# Patient Record
Sex: Female | Born: 1945 | Race: White | Hispanic: No | Marital: Married | Smoking: Current every day smoker
Health system: Southern US, Community
[De-identification: ages and names within clinical notes are randomized; demographics above are authoritative.]

## PROBLEM LIST (undated history)

## (undated) DIAGNOSIS — K069 Disorder of gingiva and edentulous alveolar ridge, unspecified: Secondary | ICD-10-CM

## (undated) DIAGNOSIS — H269 Unspecified cataract: Secondary | ICD-10-CM

## (undated) DIAGNOSIS — J439 Emphysema, unspecified: Secondary | ICD-10-CM

## (undated) DIAGNOSIS — J189 Pneumonia, unspecified organism: Secondary | ICD-10-CM

## (undated) DIAGNOSIS — E119 Type 2 diabetes mellitus without complications: Secondary | ICD-10-CM

## (undated) DIAGNOSIS — J449 Chronic obstructive pulmonary disease, unspecified: Secondary | ICD-10-CM

## (undated) HISTORY — DX: Emphysema, unspecified: J43.9

## (undated) HISTORY — DX: Unspecified cataract: H26.9

## (undated) HISTORY — DX: Chronic obstructive pulmonary disease, unspecified: J44.9

## (undated) HISTORY — DX: Disorder of gingiva and edentulous alveolar ridge, unspecified: K06.9

## (undated) HISTORY — DX: Type 2 diabetes mellitus without complications: E11.9

## (undated) HISTORY — PX: WRIST SURGERY: SHX841

## (undated) HISTORY — DX: Pneumonia, unspecified organism: J18.9

## (undated) HISTORY — PX: TONSILLECTOMY: SUR1361

## (undated) HISTORY — PX: EYE SURGERY: SHX253

---

## 1997-10-15 ENCOUNTER — Emergency Department (HOSPITAL_COMMUNITY): Admission: EM | Admit: 1997-10-15 | Discharge: 1997-10-15 | Payer: Self-pay | Admitting: Emergency Medicine

## 1999-11-26 ENCOUNTER — Other Ambulatory Visit: Admission: RE | Admit: 1999-11-26 | Discharge: 1999-11-26 | Payer: Self-pay | Admitting: Internal Medicine

## 2001-07-29 ENCOUNTER — Encounter: Payer: Self-pay | Admitting: Unknown Physician Specialty

## 2001-07-29 ENCOUNTER — Encounter: Admission: RE | Admit: 2001-07-29 | Discharge: 2001-07-29 | Payer: Self-pay | Admitting: Unknown Physician Specialty

## 2009-03-01 ENCOUNTER — Encounter: Admission: RE | Admit: 2009-03-01 | Discharge: 2009-03-01 | Payer: Self-pay | Admitting: Unknown Physician Specialty

## 2009-10-14 ENCOUNTER — Encounter: Admission: RE | Admit: 2009-10-14 | Discharge: 2009-10-14 | Payer: Self-pay | Admitting: Unknown Physician Specialty

## 2011-11-10 ENCOUNTER — Encounter: Payer: Self-pay | Admitting: Gastroenterology

## 2012-04-22 ENCOUNTER — Other Ambulatory Visit: Payer: Self-pay | Admitting: Orthopedic Surgery

## 2012-04-22 ENCOUNTER — Ambulatory Visit
Admission: RE | Admit: 2012-04-22 | Discharge: 2012-04-22 | Disposition: A | Discharge status: Home / Self Care | Payer: Managed Care, Other (non HMO) | Source: Ambulatory Visit | Attending: Orthopedic Surgery | Admitting: Orthopedic Surgery

## 2012-04-22 DIAGNOSIS — M79673 Pain in unspecified foot: Secondary | ICD-10-CM

## 2012-04-22 DIAGNOSIS — R609 Edema, unspecified: Secondary | ICD-10-CM

## 2012-04-25 ENCOUNTER — Other Ambulatory Visit: Payer: Self-pay | Admitting: Orthopedic Surgery

## 2012-07-07 ENCOUNTER — Encounter: Payer: Self-pay | Admitting: Gastroenterology

## 2012-10-04 ENCOUNTER — Other Ambulatory Visit: Payer: Self-pay | Admitting: Unknown Physician Specialty

## 2012-10-04 DIAGNOSIS — Z780 Asymptomatic menopausal state: Secondary | ICD-10-CM

## 2012-10-04 DIAGNOSIS — Z1231 Encounter for screening mammogram for malignant neoplasm of breast: Secondary | ICD-10-CM

## 2012-10-11 ENCOUNTER — Ambulatory Visit (INDEPENDENT_AMBULATORY_CARE_PROVIDER_SITE_OTHER): Payer: Managed Care, Other (non HMO)

## 2012-10-11 DIAGNOSIS — Z1231 Encounter for screening mammogram for malignant neoplasm of breast: Secondary | ICD-10-CM

## 2012-10-11 DIAGNOSIS — R928 Other abnormal and inconclusive findings on diagnostic imaging of breast: Secondary | ICD-10-CM

## 2012-10-11 DIAGNOSIS — Z780 Asymptomatic menopausal state: Secondary | ICD-10-CM

## 2012-10-11 DIAGNOSIS — M810 Age-related osteoporosis without current pathological fracture: Secondary | ICD-10-CM

## 2012-10-13 ENCOUNTER — Other Ambulatory Visit: Payer: Self-pay | Admitting: Unknown Physician Specialty

## 2012-10-13 DIAGNOSIS — R928 Other abnormal and inconclusive findings on diagnostic imaging of breast: Secondary | ICD-10-CM

## 2012-10-27 ENCOUNTER — Ambulatory Visit
Admission: RE | Admit: 2012-10-27 | Discharge: 2012-10-27 | Disposition: A | Discharge status: Home / Self Care | Payer: Medicare Other | Source: Ambulatory Visit | Attending: Unknown Physician Specialty | Admitting: Unknown Physician Specialty

## 2012-10-27 DIAGNOSIS — R928 Other abnormal and inconclusive findings on diagnostic imaging of breast: Secondary | ICD-10-CM

## 2013-10-26 ENCOUNTER — Encounter: Payer: Self-pay | Admitting: Gastroenterology

## 2013-10-31 ENCOUNTER — Other Ambulatory Visit: Payer: Self-pay | Admitting: Unknown Physician Specialty

## 2013-10-31 ENCOUNTER — Ambulatory Visit (AMBULATORY_SURGERY_CENTER): Payer: Self-pay

## 2013-10-31 VITALS — Ht 62.0 in | Wt 165.8 lb

## 2013-10-31 DIAGNOSIS — N6320 Unspecified lump in the left breast, unspecified quadrant: Secondary | ICD-10-CM

## 2013-10-31 DIAGNOSIS — Z1211 Encounter for screening for malignant neoplasm of colon: Secondary | ICD-10-CM

## 2013-10-31 MED ORDER — NA SULFATE-K SULFATE-MG SULF 17.5-3.13-1.6 GM/177ML PO SOLN: ORAL | Status: DC

## 2013-10-31 NOTE — Progress Notes (Signed)
Per pt, no allergies to soy or egg products.Pt not taking any weight loss meds or using  O2 at home. 

## 2013-11-07 ENCOUNTER — Ambulatory Visit
Admission: RE | Admit: 2013-11-07 | Discharge: 2013-11-07 | Disposition: A | Discharge status: Home / Self Care | Payer: Medicare Other | Source: Ambulatory Visit | Attending: Unknown Physician Specialty | Admitting: Unknown Physician Specialty

## 2013-11-07 ENCOUNTER — Encounter (INDEPENDENT_AMBULATORY_CARE_PROVIDER_SITE_OTHER): Payer: Self-pay

## 2013-11-07 DIAGNOSIS — N6320 Unspecified lump in the left breast, unspecified quadrant: Secondary | ICD-10-CM

## 2013-11-09 ENCOUNTER — Encounter: Payer: Self-pay | Admitting: Gastroenterology

## 2013-11-10 ENCOUNTER — Ambulatory Visit (AMBULATORY_SURGERY_CENTER): Payer: Medicare Other | Admitting: Gastroenterology

## 2013-11-10 ENCOUNTER — Encounter: Payer: Self-pay | Admitting: Gastroenterology

## 2013-11-10 VITALS — BP 119/79 | HR 79 | Temp 97.0°F | Resp 23 | Ht 62.0 in | Wt 165.0 lb

## 2013-11-10 DIAGNOSIS — K5730 Diverticulosis of large intestine without perforation or abscess without bleeding: Secondary | ICD-10-CM

## 2013-11-10 DIAGNOSIS — K648 Other hemorrhoids: Secondary | ICD-10-CM

## 2013-11-10 DIAGNOSIS — Z1211 Encounter for screening for malignant neoplasm of colon: Secondary | ICD-10-CM

## 2013-11-10 MED ORDER — SODIUM CHLORIDE 0.9 % IV SOLN: 500.0000 mL | INTRAVENOUS | Status: DC

## 2013-11-10 NOTE — Progress Notes (Signed)
No problems noted in the recovery room. maw 

## 2013-11-10 NOTE — Op Note (Signed)
[facility] [address] [place], [id number]   COLONOSCOPY PROCEDURE REPORT  PATIENT: Sheila Holmes, Sheila J.  MR#: [id number] BIRTHDATE: Aug 10, 1945 , 68  yrs. old GENDER: Female ENDOSCOPIST: Louis Meckelobert D Marlene Beidler, MD REFERRED BY: PROCEDURE DATE:  11/10/2013 PROCEDURE:   Colonoscopy, diagnostic First Screening Colonoscopy - Avg.  risk and is 50 yrs.  old or older - No.  Prior Negative Screening - Now for repeat screening. 10 or more years since last screening  History of Adenoma - Now for follow-up colonoscopy & has been > or = to 3 yrs.  N/A  Polyps Removed Today? No.  Recommend repeat exam, <10 yrs? No. ASA CLASS:   Class II INDICATIONS:Average risk patient for colon cancer. MEDICATIONS: MAC sedation, administered by CRNA and propofol (Diprivan) 250mg  IV  DESCRIPTION OF PROCEDURE:   After the risks benefits and alternatives of the procedure were thoroughly explained, informed consent was obtained.  A digital rectal exam revealed no abnormalities of the rectum.   The LB [id number]  endoscope was introduced through the anus and advanced to the ileum. No adverse events experienced.   The quality of the prep was excellent using Suprep  The instrument was then slowly withdrawn as the colon was fully examined.      COLON FINDINGS: Mild diverticulosis was noted in the sigmoid colon. Internal hemorrhoids were found.   The colon was otherwise normal. There was no diverticulosis, inflammation, polyps or cancers unless previously stated.  Retroflexed views revealed no abnormalities. The time to cecum=3 minutes 21 seconds.  Withdrawal time=7 minutes 27 seconds.  The scope was withdrawn and the procedure completed. COMPLICATIONS: There were no complications.  ENDOSCOPIC IMPRESSION: 1.   Mild diverticulosis was noted in the sigmoid colon 2.   Internal hemorrhoids 3.   The colon was otherwise normal  RECOMMENDATIONS: Continue current colorectal screening  recommendations for "routine risk" patients with a repeat colonoscopy in 10 years.   eSigned:  Louis Meckelobert D Kathline Banbury, MD 11/10/2013 10:59 AM   cc: Loleta DickerErin Judge, MD   PATIENT NAME:  Sheila Holmes, Sheila J. MR#: [id number]

## 2013-11-10 NOTE — Patient Instructions (Signed)
YOU HAD AN ENDOSCOPIC PROCEDURE TODAY AT THE [facility]: Refer to the procedure report that was given to you for any specific questions about what was found during the examination.  If the procedure report does not answer your questions, please call your gastroenterologist to clarify.  If you requested that your care partner not be given the details of your procedure findings, then the procedure report has been included in a sealed envelope for you to review at your convenience later.  YOU SHOULD EXPECT: Some feelings of bloating in the abdomen. Passage of more gas than usual.  Walking can help get rid of the air that was put into your GI tract during the procedure and reduce the bloating. If you had a lower endoscopy (such as a colonoscopy or flexible sigmoidoscopy) you may notice spotting of blood in your stool or on the toilet paper. If you underwent a bowel prep for your procedure, then you may not have a normal bowel movement for a few days.  DIET: Your first meal following the procedure should be a light meal and then it is ok to progress to your normal diet.  A half-sandwich or bowl of soup is an example of a good first meal.  Heavy or fried foods are harder to digest and may make you feel nauseous or bloated.  Likewise meals heavy in dairy and vegetables can cause extra gas to form and this can also increase the bloating.  Drink plenty of fluids but you should avoid alcoholic beverages for 24 hours.  ACTIVITY: Your care partner should take you home directly after the procedure.  You should plan to take it easy, moving slowly for the rest of the day.  You can resume normal activity the day after the procedure however you should NOT DRIVE or use heavy machinery for 24 hours (because of the sedation medicines used during the test).    SYMPTOMS TO REPORT IMMEDIATELY: A gastroenterologist can be reached at any hour.  During normal business hours, 8:30 AM to 5:00 PM Monday through Friday,  call [phone].  After hours and on weekends, please call the GI answering service at [phone] who will take a message and have the physician on call contact you.   Following lower endoscopy (colonoscopy or flexible sigmoidoscopy):  Excessive amounts of blood in the stool  Significant tenderness or worsening of abdominal pains  Swelling of the abdomen that is new, acute  Fever of 100F or higher   FOLLOW UP: If any biopsies were taken you will be contacted by phone or by letter within the next 1-3 weeks.  Call your gastroenterologist if you have not heard about the biopsies in 3 weeks.  Our staff will call the home number listed on your records the next business day following your procedure to check on you and address any questions or concerns that you may have at that time regarding the information given to you following your procedure. This is a courtesy call and so if there is no answer at the home number and we have not heard from you through the emergency physician on call, we will assume that you have returned to your regular daily activities without incident.  SIGNATURES/CONFIDENTIALITY: You and/or your care partner have signed paperwork which will be entered into your electronic medical record.  These signatures attest to the fact that that the information above on your After Visit Summary has been reviewed and is understood.  Full responsibility of the confidentiality of   this discharge information lies with you and/or your care-partner.     Handouts were given to your care partner on  diverticulosis, a high fiber diet with liberal fluid intake and hemorrhoids. You may resume your current medications today. Please call if any questions or concerns.   

## 2013-11-10 NOTE — Progress Notes (Signed)
Report to PACU, RN, vss, BBS= Clear.  

## 2013-11-13 ENCOUNTER — Telehealth: Payer: Self-pay | Admitting: *Deleted

## 2013-11-13 NOTE — Telephone Encounter (Signed)
  Follow up Call-  Call back number 11/10/2013  Post procedure Call Back phone  # [phone]  Permission to leave phone message Yes     Patient questions:  Do you have a fever, pain , or abdominal swelling? No. Pain Score  0 *  Have you tolerated food without any problems? Yes.    Have you been able to return to your normal activities? Yes.    Do you have any questions about your discharge instructions: Diet   No. Medications  No. Follow up visit  No.  Do you have questions or concerns about your Care? No.  Actions: * If pain score is 4 or above: No action needed, pain <4.

## 2014-07-10 DIAGNOSIS — E119 Type 2 diabetes mellitus without complications: Secondary | ICD-10-CM | POA: Diagnosis not present

## 2014-07-10 DIAGNOSIS — M810 Age-related osteoporosis without current pathological fracture: Secondary | ICD-10-CM | POA: Diagnosis not present

## 2014-07-10 DIAGNOSIS — E780 Pure hypercholesterolemia: Secondary | ICD-10-CM | POA: Diagnosis not present

## 2014-07-10 DIAGNOSIS — J449 Chronic obstructive pulmonary disease, unspecified: Secondary | ICD-10-CM | POA: Diagnosis not present

## 2014-07-19 DIAGNOSIS — R1310 Dysphagia, unspecified: Secondary | ICD-10-CM | POA: Diagnosis not present

## 2014-07-31 DIAGNOSIS — H524 Presbyopia: Secondary | ICD-10-CM | POA: Diagnosis not present

## 2014-08-07 DIAGNOSIS — R1310 Dysphagia, unspecified: Secondary | ICD-10-CM | POA: Diagnosis not present

## 2014-08-07 DIAGNOSIS — R1313 Dysphagia, pharyngeal phase: Secondary | ICD-10-CM | POA: Diagnosis not present

## 2014-11-06 DIAGNOSIS — Z0000 Encounter for general adult medical examination without abnormal findings: Secondary | ICD-10-CM | POA: Diagnosis not present

## 2014-11-06 DIAGNOSIS — R5383 Other fatigue: Secondary | ICD-10-CM | POA: Diagnosis not present

## 2014-11-06 DIAGNOSIS — E780 Pure hypercholesterolemia: Secondary | ICD-10-CM | POA: Diagnosis not present

## 2014-11-06 DIAGNOSIS — E119 Type 2 diabetes mellitus without complications: Secondary | ICD-10-CM | POA: Diagnosis not present

## 2014-11-06 DIAGNOSIS — M810 Age-related osteoporosis without current pathological fracture: Secondary | ICD-10-CM | POA: Diagnosis not present

## 2014-11-06 DIAGNOSIS — Z1289 Encounter for screening for malignant neoplasm of other sites: Secondary | ICD-10-CM | POA: Diagnosis not present

## 2014-11-06 DIAGNOSIS — J449 Chronic obstructive pulmonary disease, unspecified: Secondary | ICD-10-CM | POA: Diagnosis not present

## 2014-11-06 DIAGNOSIS — Z23 Encounter for immunization: Secondary | ICD-10-CM | POA: Diagnosis not present

## 2014-11-27 DIAGNOSIS — Z23 Encounter for immunization: Secondary | ICD-10-CM | POA: Diagnosis not present

## 2014-12-21 ENCOUNTER — Other Ambulatory Visit: Payer: Self-pay | Admitting: Unknown Physician Specialty

## 2014-12-21 DIAGNOSIS — N63 Unspecified lump in unspecified breast: Secondary | ICD-10-CM

## 2014-12-27 ENCOUNTER — Ambulatory Visit
Admission: RE | Admit: 2014-12-27 | Discharge: 2014-12-27 | Disposition: A | Discharge status: Home / Self Care | Payer: Medicare Other | Source: Ambulatory Visit | Attending: Unknown Physician Specialty | Admitting: Unknown Physician Specialty

## 2014-12-27 DIAGNOSIS — N63 Unspecified lump in unspecified breast: Secondary | ICD-10-CM

## 2014-12-27 DIAGNOSIS — N6489 Other specified disorders of breast: Secondary | ICD-10-CM | POA: Diagnosis not present

## 2014-12-27 DIAGNOSIS — R928 Other abnormal and inconclusive findings on diagnostic imaging of breast: Secondary | ICD-10-CM | POA: Diagnosis not present

## 2015-03-06 DIAGNOSIS — E559 Vitamin D deficiency, unspecified: Secondary | ICD-10-CM | POA: Diagnosis not present

## 2015-03-06 DIAGNOSIS — J449 Chronic obstructive pulmonary disease, unspecified: Secondary | ICD-10-CM | POA: Diagnosis not present

## 2015-03-06 DIAGNOSIS — E7800 Pure hypercholesterolemia, unspecified: Secondary | ICD-10-CM | POA: Diagnosis not present

## 2015-03-06 DIAGNOSIS — M810 Age-related osteoporosis without current pathological fracture: Secondary | ICD-10-CM | POA: Diagnosis not present

## 2015-03-06 DIAGNOSIS — E119 Type 2 diabetes mellitus without complications: Secondary | ICD-10-CM | POA: Diagnosis not present

## 2015-03-06 DIAGNOSIS — M899 Disorder of bone, unspecified: Secondary | ICD-10-CM | POA: Diagnosis not present

## 2015-07-04 DIAGNOSIS — E7800 Pure hypercholesterolemia, unspecified: Secondary | ICD-10-CM | POA: Diagnosis not present

## 2015-07-04 DIAGNOSIS — M810 Age-related osteoporosis without current pathological fracture: Secondary | ICD-10-CM | POA: Diagnosis not present

## 2015-07-04 DIAGNOSIS — J449 Chronic obstructive pulmonary disease, unspecified: Secondary | ICD-10-CM | POA: Diagnosis not present

## 2015-07-04 DIAGNOSIS — E119 Type 2 diabetes mellitus without complications: Secondary | ICD-10-CM | POA: Diagnosis not present

## 2015-07-04 DIAGNOSIS — S40252A Superficial foreign body of left shoulder, initial encounter: Secondary | ICD-10-CM | POA: Diagnosis not present

## 2015-08-05 DIAGNOSIS — S80261A Insect bite (nonvenomous), right knee, initial encounter: Secondary | ICD-10-CM | POA: Diagnosis not present

## 2015-08-05 DIAGNOSIS — W57XXXA Bitten or stung by nonvenomous insect and other nonvenomous arthropods, initial encounter: Secondary | ICD-10-CM | POA: Diagnosis not present

## 2015-10-28 DIAGNOSIS — E119 Type 2 diabetes mellitus without complications: Secondary | ICD-10-CM | POA: Diagnosis not present

## 2015-10-28 DIAGNOSIS — H5203 Hypermetropia, bilateral: Secondary | ICD-10-CM | POA: Diagnosis not present

## 2015-10-28 DIAGNOSIS — Z961 Presence of intraocular lens: Secondary | ICD-10-CM | POA: Diagnosis not present

## 2015-10-28 DIAGNOSIS — H509 Unspecified strabismus: Secondary | ICD-10-CM | POA: Diagnosis not present

## 2015-11-05 DIAGNOSIS — E559 Vitamin D deficiency, unspecified: Secondary | ICD-10-CM | POA: Diagnosis not present

## 2015-11-05 DIAGNOSIS — E119 Type 2 diabetes mellitus without complications: Secondary | ICD-10-CM | POA: Diagnosis not present

## 2015-11-05 DIAGNOSIS — J449 Chronic obstructive pulmonary disease, unspecified: Secondary | ICD-10-CM | POA: Diagnosis not present

## 2015-11-05 DIAGNOSIS — K5790 Diverticulosis of intestine, part unspecified, without perforation or abscess without bleeding: Secondary | ICD-10-CM | POA: Diagnosis not present

## 2015-11-05 DIAGNOSIS — Z0000 Encounter for general adult medical examination without abnormal findings: Secondary | ICD-10-CM | POA: Diagnosis not present

## 2015-11-05 DIAGNOSIS — Z124 Encounter for screening for malignant neoplasm of cervix: Secondary | ICD-10-CM | POA: Diagnosis not present

## 2015-11-05 DIAGNOSIS — Z1231 Encounter for screening mammogram for malignant neoplasm of breast: Secondary | ICD-10-CM | POA: Diagnosis not present

## 2015-11-05 DIAGNOSIS — E7800 Pure hypercholesterolemia, unspecified: Secondary | ICD-10-CM | POA: Diagnosis not present

## 2015-11-05 DIAGNOSIS — M810 Age-related osteoporosis without current pathological fracture: Secondary | ICD-10-CM | POA: Diagnosis not present

## 2015-12-10 DIAGNOSIS — Z23 Encounter for immunization: Secondary | ICD-10-CM | POA: Diagnosis not present

## 2015-12-16 ENCOUNTER — Other Ambulatory Visit: Payer: Self-pay | Admitting: Unknown Physician Specialty

## 2015-12-16 DIAGNOSIS — R5381 Other malaise: Secondary | ICD-10-CM

## 2015-12-17 ENCOUNTER — Other Ambulatory Visit: Payer: Self-pay | Admitting: Unknown Physician Specialty

## 2015-12-17 DIAGNOSIS — M810 Age-related osteoporosis without current pathological fracture: Secondary | ICD-10-CM

## 2015-12-17 DIAGNOSIS — Z1231 Encounter for screening mammogram for malignant neoplasm of breast: Secondary | ICD-10-CM

## 2015-12-17 DIAGNOSIS — Z780 Asymptomatic menopausal state: Secondary | ICD-10-CM

## 2015-12-31 ENCOUNTER — Ambulatory Visit (INDEPENDENT_AMBULATORY_CARE_PROVIDER_SITE_OTHER): Payer: Medicare Other

## 2015-12-31 DIAGNOSIS — M810 Age-related osteoporosis without current pathological fracture: Secondary | ICD-10-CM | POA: Diagnosis not present

## 2015-12-31 DIAGNOSIS — M85852 Other specified disorders of bone density and structure, left thigh: Secondary | ICD-10-CM

## 2015-12-31 DIAGNOSIS — Z1231 Encounter for screening mammogram for malignant neoplasm of breast: Secondary | ICD-10-CM | POA: Diagnosis not present

## 2016-03-05 DIAGNOSIS — M810 Age-related osteoporosis without current pathological fracture: Secondary | ICD-10-CM | POA: Diagnosis not present

## 2016-03-05 DIAGNOSIS — E119 Type 2 diabetes mellitus without complications: Secondary | ICD-10-CM | POA: Diagnosis not present

## 2016-03-05 DIAGNOSIS — E7800 Pure hypercholesterolemia, unspecified: Secondary | ICD-10-CM | POA: Diagnosis not present

## 2016-03-05 DIAGNOSIS — J449 Chronic obstructive pulmonary disease, unspecified: Secondary | ICD-10-CM | POA: Diagnosis not present

## 2016-05-18 DIAGNOSIS — L03312 Cellulitis of back [any part except buttock]: Secondary | ICD-10-CM | POA: Diagnosis not present

## 2016-05-18 DIAGNOSIS — S40251A Superficial foreign body of right shoulder, initial encounter: Secondary | ICD-10-CM | POA: Diagnosis not present

## 2016-07-22 DIAGNOSIS — E7800 Pure hypercholesterolemia, unspecified: Secondary | ICD-10-CM | POA: Diagnosis not present

## 2016-07-22 DIAGNOSIS — H527 Unspecified disorder of refraction: Secondary | ICD-10-CM | POA: Diagnosis not present

## 2016-07-22 DIAGNOSIS — H53002 Unspecified amblyopia, left eye: Secondary | ICD-10-CM | POA: Diagnosis not present

## 2016-07-22 DIAGNOSIS — H538 Other visual disturbances: Secondary | ICD-10-CM | POA: Diagnosis not present

## 2016-07-22 DIAGNOSIS — E119 Type 2 diabetes mellitus without complications: Secondary | ICD-10-CM | POA: Diagnosis not present

## 2016-07-22 DIAGNOSIS — M810 Age-related osteoporosis without current pathological fracture: Secondary | ICD-10-CM | POA: Diagnosis not present

## 2016-07-22 DIAGNOSIS — J449 Chronic obstructive pulmonary disease, unspecified: Secondary | ICD-10-CM | POA: Diagnosis not present

## 2016-08-24 DIAGNOSIS — E119 Type 2 diabetes mellitus without complications: Secondary | ICD-10-CM | POA: Diagnosis not present

## 2016-08-24 DIAGNOSIS — H53002 Unspecified amblyopia, left eye: Secondary | ICD-10-CM | POA: Diagnosis not present

## 2016-08-24 DIAGNOSIS — H538 Other visual disturbances: Secondary | ICD-10-CM | POA: Diagnosis not present

## 2016-08-24 DIAGNOSIS — H527 Unspecified disorder of refraction: Secondary | ICD-10-CM | POA: Diagnosis not present

## 2016-10-29 DIAGNOSIS — Z961 Presence of intraocular lens: Secondary | ICD-10-CM | POA: Diagnosis not present

## 2016-10-29 DIAGNOSIS — H509 Unspecified strabismus: Secondary | ICD-10-CM | POA: Diagnosis not present

## 2016-10-29 DIAGNOSIS — H5203 Hypermetropia, bilateral: Secondary | ICD-10-CM | POA: Diagnosis not present

## 2016-10-29 DIAGNOSIS — E119 Type 2 diabetes mellitus without complications: Secondary | ICD-10-CM | POA: Diagnosis not present

## 2016-11-05 DIAGNOSIS — Z0000 Encounter for general adult medical examination without abnormal findings: Secondary | ICD-10-CM | POA: Diagnosis not present

## 2016-11-05 DIAGNOSIS — E559 Vitamin D deficiency, unspecified: Secondary | ICD-10-CM | POA: Diagnosis not present

## 2016-11-05 DIAGNOSIS — Z1231 Encounter for screening mammogram for malignant neoplasm of breast: Secondary | ICD-10-CM | POA: Diagnosis not present

## 2016-11-05 DIAGNOSIS — E119 Type 2 diabetes mellitus without complications: Secondary | ICD-10-CM | POA: Diagnosis not present

## 2016-11-05 DIAGNOSIS — E7800 Pure hypercholesterolemia, unspecified: Secondary | ICD-10-CM | POA: Diagnosis not present

## 2016-11-05 DIAGNOSIS — M810 Age-related osteoporosis without current pathological fracture: Secondary | ICD-10-CM | POA: Diagnosis not present

## 2016-11-05 DIAGNOSIS — M899 Disorder of bone, unspecified: Secondary | ICD-10-CM | POA: Diagnosis not present

## 2016-12-04 DIAGNOSIS — Z23 Encounter for immunization: Secondary | ICD-10-CM | POA: Diagnosis not present

## 2016-12-08 DIAGNOSIS — Z1211 Encounter for screening for malignant neoplasm of colon: Secondary | ICD-10-CM | POA: Diagnosis not present

## 2017-01-07 DIAGNOSIS — J9801 Acute bronchospasm: Secondary | ICD-10-CM | POA: Diagnosis not present

## 2017-01-07 DIAGNOSIS — J069 Acute upper respiratory infection, unspecified: Secondary | ICD-10-CM | POA: Diagnosis not present

## 2017-04-06 DIAGNOSIS — J439 Emphysema, unspecified: Secondary | ICD-10-CM | POA: Diagnosis not present

## 2017-04-06 DIAGNOSIS — E7800 Pure hypercholesterolemia, unspecified: Secondary | ICD-10-CM | POA: Diagnosis not present

## 2017-04-06 DIAGNOSIS — M810 Age-related osteoporosis without current pathological fracture: Secondary | ICD-10-CM | POA: Diagnosis not present

## 2017-04-06 DIAGNOSIS — E119 Type 2 diabetes mellitus without complications: Secondary | ICD-10-CM | POA: Diagnosis not present

## 2017-04-07 DIAGNOSIS — E119 Type 2 diabetes mellitus without complications: Secondary | ICD-10-CM | POA: Diagnosis not present

## 2017-04-07 DIAGNOSIS — E7800 Pure hypercholesterolemia, unspecified: Secondary | ICD-10-CM | POA: Diagnosis not present

## 2017-06-23 DIAGNOSIS — J439 Emphysema, unspecified: Secondary | ICD-10-CM | POA: Diagnosis not present

## 2017-06-23 DIAGNOSIS — M810 Age-related osteoporosis without current pathological fracture: Secondary | ICD-10-CM | POA: Diagnosis not present

## 2017-06-23 DIAGNOSIS — E7800 Pure hypercholesterolemia, unspecified: Secondary | ICD-10-CM | POA: Diagnosis not present

## 2017-06-23 DIAGNOSIS — E119 Type 2 diabetes mellitus without complications: Secondary | ICD-10-CM | POA: Diagnosis not present

## 2017-06-29 DIAGNOSIS — R600 Localized edema: Secondary | ICD-10-CM | POA: Diagnosis not present

## 2017-07-21 DIAGNOSIS — L03314 Cellulitis of groin: Secondary | ICD-10-CM | POA: Diagnosis not present

## 2017-07-21 DIAGNOSIS — W57XXXA Bitten or stung by nonvenomous insect and other nonvenomous arthropods, initial encounter: Secondary | ICD-10-CM | POA: Diagnosis not present

## 2017-11-08 DIAGNOSIS — Z1231 Encounter for screening mammogram for malignant neoplasm of breast: Secondary | ICD-10-CM | POA: Diagnosis not present

## 2017-11-08 DIAGNOSIS — R5383 Other fatigue: Secondary | ICD-10-CM | POA: Diagnosis not present

## 2017-11-08 DIAGNOSIS — E1169 Type 2 diabetes mellitus with other specified complication: Secondary | ICD-10-CM | POA: Diagnosis not present

## 2017-11-08 DIAGNOSIS — E7800 Pure hypercholesterolemia, unspecified: Secondary | ICD-10-CM | POA: Diagnosis not present

## 2017-11-08 DIAGNOSIS — E559 Vitamin D deficiency, unspecified: Secondary | ICD-10-CM | POA: Diagnosis not present

## 2017-11-08 DIAGNOSIS — Z0000 Encounter for general adult medical examination without abnormal findings: Secondary | ICD-10-CM | POA: Diagnosis not present

## 2017-11-08 DIAGNOSIS — M810 Age-related osteoporosis without current pathological fracture: Secondary | ICD-10-CM | POA: Diagnosis not present

## 2017-11-17 DIAGNOSIS — Z1211 Encounter for screening for malignant neoplasm of colon: Secondary | ICD-10-CM | POA: Diagnosis not present

## 2017-11-23 DIAGNOSIS — D751 Secondary polycythemia: Secondary | ICD-10-CM | POA: Diagnosis not present

## 2017-11-23 DIAGNOSIS — E785 Hyperlipidemia, unspecified: Secondary | ICD-10-CM | POA: Diagnosis not present

## 2017-11-23 DIAGNOSIS — E1169 Type 2 diabetes mellitus with other specified complication: Secondary | ICD-10-CM | POA: Diagnosis not present

## 2017-11-30 DIAGNOSIS — D751 Secondary polycythemia: Secondary | ICD-10-CM | POA: Diagnosis not present

## 2017-12-07 DIAGNOSIS — E1169 Type 2 diabetes mellitus with other specified complication: Secondary | ICD-10-CM | POA: Diagnosis not present

## 2017-12-07 DIAGNOSIS — E785 Hyperlipidemia, unspecified: Secondary | ICD-10-CM | POA: Diagnosis not present

## 2017-12-07 DIAGNOSIS — D751 Secondary polycythemia: Secondary | ICD-10-CM | POA: Diagnosis not present

## 2017-12-14 DIAGNOSIS — E1169 Type 2 diabetes mellitus with other specified complication: Secondary | ICD-10-CM | POA: Diagnosis not present

## 2017-12-14 DIAGNOSIS — D751 Secondary polycythemia: Secondary | ICD-10-CM | POA: Diagnosis not present

## 2017-12-14 DIAGNOSIS — E785 Hyperlipidemia, unspecified: Secondary | ICD-10-CM | POA: Diagnosis not present

## 2017-12-16 DIAGNOSIS — Z23 Encounter for immunization: Secondary | ICD-10-CM | POA: Diagnosis not present

## 2017-12-21 DIAGNOSIS — Z720 Tobacco use: Secondary | ICD-10-CM | POA: Diagnosis not present

## 2017-12-21 DIAGNOSIS — D751 Secondary polycythemia: Secondary | ICD-10-CM | POA: Diagnosis not present

## 2017-12-21 DIAGNOSIS — E785 Hyperlipidemia, unspecified: Secondary | ICD-10-CM | POA: Diagnosis not present

## 2017-12-21 DIAGNOSIS — E1169 Type 2 diabetes mellitus with other specified complication: Secondary | ICD-10-CM | POA: Diagnosis not present

## 2018-01-04 DIAGNOSIS — E785 Hyperlipidemia, unspecified: Secondary | ICD-10-CM | POA: Diagnosis not present

## 2018-01-04 DIAGNOSIS — D751 Secondary polycythemia: Secondary | ICD-10-CM | POA: Diagnosis not present

## 2018-01-04 DIAGNOSIS — E1169 Type 2 diabetes mellitus with other specified complication: Secondary | ICD-10-CM | POA: Diagnosis not present

## 2018-01-18 DIAGNOSIS — E785 Hyperlipidemia, unspecified: Secondary | ICD-10-CM | POA: Diagnosis not present

## 2018-01-18 DIAGNOSIS — D751 Secondary polycythemia: Secondary | ICD-10-CM | POA: Diagnosis not present

## 2018-01-18 DIAGNOSIS — E1169 Type 2 diabetes mellitus with other specified complication: Secondary | ICD-10-CM | POA: Diagnosis not present

## 2018-01-26 DIAGNOSIS — H509 Unspecified strabismus: Secondary | ICD-10-CM | POA: Diagnosis not present

## 2018-01-26 DIAGNOSIS — H532 Diplopia: Secondary | ICD-10-CM | POA: Diagnosis not present

## 2018-01-26 DIAGNOSIS — Z961 Presence of intraocular lens: Secondary | ICD-10-CM | POA: Diagnosis not present

## 2018-01-26 DIAGNOSIS — H5203 Hypermetropia, bilateral: Secondary | ICD-10-CM | POA: Diagnosis not present

## 2018-01-26 DIAGNOSIS — E113291 Type 2 diabetes mellitus with mild nonproliferative diabetic retinopathy without macular edema, right eye: Secondary | ICD-10-CM | POA: Diagnosis not present

## 2018-02-08 DIAGNOSIS — E119 Type 2 diabetes mellitus without complications: Secondary | ICD-10-CM | POA: Diagnosis not present

## 2018-02-08 DIAGNOSIS — Z87891 Personal history of nicotine dependence: Secondary | ICD-10-CM | POA: Diagnosis not present

## 2018-02-08 DIAGNOSIS — D751 Secondary polycythemia: Secondary | ICD-10-CM | POA: Diagnosis not present

## 2018-02-18 DIAGNOSIS — S2231XA Fracture of one rib, right side, initial encounter for closed fracture: Secondary | ICD-10-CM | POA: Diagnosis not present

## 2018-03-07 DIAGNOSIS — R600 Localized edema: Secondary | ICD-10-CM | POA: Diagnosis not present

## 2018-03-07 DIAGNOSIS — E7800 Pure hypercholesterolemia, unspecified: Secondary | ICD-10-CM | POA: Diagnosis not present

## 2018-03-07 DIAGNOSIS — E1169 Type 2 diabetes mellitus with other specified complication: Secondary | ICD-10-CM | POA: Diagnosis not present

## 2018-03-07 DIAGNOSIS — J439 Emphysema, unspecified: Secondary | ICD-10-CM | POA: Diagnosis not present

## 2018-03-08 DIAGNOSIS — D751 Secondary polycythemia: Secondary | ICD-10-CM | POA: Diagnosis not present

## 2018-03-09 ENCOUNTER — Other Ambulatory Visit: Payer: Self-pay | Admitting: Unknown Physician Specialty

## 2018-03-09 ENCOUNTER — Ambulatory Visit (INDEPENDENT_AMBULATORY_CARE_PROVIDER_SITE_OTHER): Payer: Medicare Other

## 2018-03-09 DIAGNOSIS — J449 Chronic obstructive pulmonary disease, unspecified: Secondary | ICD-10-CM | POA: Diagnosis not present

## 2018-03-09 DIAGNOSIS — R05 Cough: Secondary | ICD-10-CM | POA: Diagnosis not present

## 2018-03-09 DIAGNOSIS — R0602 Shortness of breath: Secondary | ICD-10-CM

## 2018-03-10 ENCOUNTER — Emergency Department (HOSPITAL_COMMUNITY): Payer: Medicare Other

## 2018-03-10 ENCOUNTER — Other Ambulatory Visit: Payer: Self-pay

## 2018-03-10 ENCOUNTER — Inpatient Hospital Stay (HOSPITAL_COMMUNITY): Payer: Medicare Other

## 2018-03-10 ENCOUNTER — Inpatient Hospital Stay (HOSPITAL_COMMUNITY)
Admission: EM | Admit: 2018-03-10 | Discharge: 2018-03-15 | DRG: 193 | Disposition: A | Discharge status: Skilled Nursing Facility | Payer: Medicare Other | Attending: Internal Medicine | Admitting: Internal Medicine

## 2018-03-10 ENCOUNTER — Encounter (HOSPITAL_COMMUNITY): Payer: Self-pay | Admitting: Family Medicine

## 2018-03-10 DIAGNOSIS — R740 Nonspecific elevation of levels of transaminase and lactic acid dehydrogenase [LDH]: Secondary | ICD-10-CM

## 2018-03-10 DIAGNOSIS — R404 Transient alteration of awareness: Secondary | ICD-10-CM | POA: Diagnosis not present

## 2018-03-10 DIAGNOSIS — D638 Anemia in other chronic diseases classified elsewhere: Secondary | ICD-10-CM | POA: Diagnosis present

## 2018-03-10 DIAGNOSIS — J80 Acute respiratory distress syndrome: Secondary | ICD-10-CM | POA: Diagnosis not present

## 2018-03-10 DIAGNOSIS — R000 Tachycardia, unspecified: Secondary | ICD-10-CM | POA: Diagnosis not present

## 2018-03-10 DIAGNOSIS — K8020 Calculus of gallbladder without cholecystitis without obstruction: Secondary | ICD-10-CM | POA: Diagnosis not present

## 2018-03-10 DIAGNOSIS — J441 Chronic obstructive pulmonary disease with (acute) exacerbation: Secondary | ICD-10-CM | POA: Diagnosis present

## 2018-03-10 DIAGNOSIS — R7401 Elevation of levels of liver transaminase levels: Secondary | ICD-10-CM | POA: Diagnosis present

## 2018-03-10 DIAGNOSIS — R2689 Other abnormalities of gait and mobility: Secondary | ICD-10-CM | POA: Diagnosis not present

## 2018-03-10 DIAGNOSIS — Z880 Allergy status to penicillin: Secondary | ICD-10-CM | POA: Diagnosis not present

## 2018-03-10 DIAGNOSIS — M7989 Other specified soft tissue disorders: Secondary | ICD-10-CM | POA: Insufficient documentation

## 2018-03-10 DIAGNOSIS — R0602 Shortness of breath: Secondary | ICD-10-CM | POA: Diagnosis not present

## 2018-03-10 DIAGNOSIS — D649 Anemia, unspecified: Secondary | ICD-10-CM | POA: Diagnosis not present

## 2018-03-10 DIAGNOSIS — J9601 Acute respiratory failure with hypoxia: Secondary | ICD-10-CM | POA: Diagnosis not present

## 2018-03-10 DIAGNOSIS — D72819 Decreased white blood cell count, unspecified: Secondary | ICD-10-CM | POA: Diagnosis present

## 2018-03-10 DIAGNOSIS — M79671 Pain in right foot: Secondary | ICD-10-CM | POA: Diagnosis not present

## 2018-03-10 DIAGNOSIS — J180 Bronchopneumonia, unspecified organism: Secondary | ICD-10-CM | POA: Diagnosis not present

## 2018-03-10 DIAGNOSIS — F17210 Nicotine dependence, cigarettes, uncomplicated: Secondary | ICD-10-CM | POA: Diagnosis present

## 2018-03-10 DIAGNOSIS — J449 Chronic obstructive pulmonary disease, unspecified: Secondary | ICD-10-CM | POA: Diagnosis not present

## 2018-03-10 DIAGNOSIS — D751 Secondary polycythemia: Secondary | ICD-10-CM | POA: Diagnosis not present

## 2018-03-10 DIAGNOSIS — E872 Acidosis: Secondary | ICD-10-CM | POA: Diagnosis not present

## 2018-03-10 DIAGNOSIS — Z79899 Other long term (current) drug therapy: Secondary | ICD-10-CM | POA: Diagnosis not present

## 2018-03-10 DIAGNOSIS — Z9181 History of falling: Secondary | ICD-10-CM

## 2018-03-10 DIAGNOSIS — E119 Type 2 diabetes mellitus without complications: Secondary | ICD-10-CM | POA: Diagnosis not present

## 2018-03-10 DIAGNOSIS — Z7984 Long term (current) use of oral hypoglycemic drugs: Secondary | ICD-10-CM | POA: Diagnosis not present

## 2018-03-10 DIAGNOSIS — G9340 Encephalopathy, unspecified: Secondary | ICD-10-CM | POA: Diagnosis not present

## 2018-03-10 DIAGNOSIS — R0902 Hypoxemia: Secondary | ICD-10-CM | POA: Diagnosis not present

## 2018-03-10 DIAGNOSIS — M6281 Muscle weakness (generalized): Secondary | ICD-10-CM | POA: Diagnosis not present

## 2018-03-10 DIAGNOSIS — J189 Pneumonia, unspecified organism: Secondary | ICD-10-CM | POA: Diagnosis not present

## 2018-03-10 DIAGNOSIS — Z7951 Long term (current) use of inhaled steroids: Secondary | ICD-10-CM

## 2018-03-10 DIAGNOSIS — Z7982 Long term (current) use of aspirin: Secondary | ICD-10-CM | POA: Diagnosis not present

## 2018-03-10 DIAGNOSIS — M2550 Pain in unspecified joint: Secondary | ICD-10-CM | POA: Diagnosis not present

## 2018-03-10 DIAGNOSIS — Z7401 Bed confinement status: Secondary | ICD-10-CM | POA: Diagnosis not present

## 2018-03-10 DIAGNOSIS — J439 Emphysema, unspecified: Secondary | ICD-10-CM | POA: Diagnosis present

## 2018-03-10 DIAGNOSIS — J9602 Acute respiratory failure with hypercapnia: Secondary | ICD-10-CM | POA: Diagnosis not present

## 2018-03-10 DIAGNOSIS — E785 Hyperlipidemia, unspecified: Secondary | ICD-10-CM | POA: Diagnosis not present

## 2018-03-10 DIAGNOSIS — R278 Other lack of coordination: Secondary | ICD-10-CM | POA: Diagnosis not present

## 2018-03-10 DIAGNOSIS — S99921A Unspecified injury of right foot, initial encounter: Secondary | ICD-10-CM | POA: Diagnosis not present

## 2018-03-10 DIAGNOSIS — R069 Unspecified abnormalities of breathing: Secondary | ICD-10-CM | POA: Diagnosis not present

## 2018-03-10 DIAGNOSIS — J9600 Acute respiratory failure, unspecified whether with hypoxia or hypercapnia: Secondary | ICD-10-CM | POA: Diagnosis not present

## 2018-03-10 DIAGNOSIS — S0990XA Unspecified injury of head, initial encounter: Secondary | ICD-10-CM | POA: Diagnosis not present

## 2018-03-10 LAB — I-STAT ARTERIAL BLOOD GAS, ED
Acid-Base Excess: 3 mmol/L — ABNORMAL HIGH (ref 0.0–2.0)
Bicarbonate: 33.7 mmol/L — ABNORMAL HIGH (ref 20.0–28.0)
O2 Saturation: 95 %
Patient temperature: 98.6
TCO2: 36 mmol/L — ABNORMAL HIGH (ref 22–32)
pCO2 arterial: 79.2 mmHg (ref 32.0–48.0)
pH, Arterial: 7.237 — ABNORMAL LOW (ref 7.350–7.450)
pO2, Arterial: 91 mmHg (ref 83.0–108.0)

## 2018-03-10 LAB — COMPREHENSIVE METABOLIC PANEL
ALT: 695 U/L — ABNORMAL HIGH (ref 0–44)
ANION GAP: 10 (ref 5–15)
AST: 1928 U/L — ABNORMAL HIGH (ref 15–41)
Albumin: 3 g/dL — ABNORMAL LOW (ref 3.5–5.0)
Alkaline Phosphatase: 71 U/L (ref 38–126)
BUN: 22 mg/dL (ref 8–23)
CO2: 26 mmol/L (ref 22–32)
Calcium: 9.5 mg/dL (ref 8.9–10.3)
Chloride: 105 mmol/L (ref 98–111)
Creatinine, Ser: 0.68 mg/dL (ref 0.44–1.00)
GFR calc non Af Amer: >60 mL/min (ref >60)
Glucose, Bld: 162 mg/dL — ABNORMAL HIGH (ref 70–99)
Potassium: 5.4 mmol/L — ABNORMAL HIGH (ref 3.5–5.1)
Sodium: 141 mmol/L (ref 135–145)
Total Bilirubin: 0.6 mg/dL (ref 0.3–1.2)
Total Protein: 5.9 g/dL — ABNORMAL LOW (ref 6.5–8.1)

## 2018-03-10 LAB — I-STAT VENOUS BLOOD GAS, ED
Acid-Base Excess: 4 mmol/L — ABNORMAL HIGH (ref 0.0–2.0)
Acid-Base Excess: 6 mmol/L — ABNORMAL HIGH (ref 0.0–2.0)
Bicarbonate: 35.9 mmol/L — ABNORMAL HIGH (ref 20.0–28.0)
Bicarbonate: 36.3 mmol/L — ABNORMAL HIGH (ref 20.0–28.0)
O2 SAT: 31 %
O2 Saturation: 73 %
PCO2 VEN: 84 mmHg — AB (ref 44.0–60.0)
TCO2: 39 mmol/L — ABNORMAL HIGH (ref 22–32)
TCO2: 39 mmol/L — ABNORMAL HIGH (ref 22–32)
pCO2, Ven: 96 mmHg (ref 44.0–60.0)
pH, Ven: 7.181 — CL (ref 7.250–7.430)
pH, Ven: 7.244 — ABNORMAL LOW (ref 7.250–7.430)
pO2, Ven: 26 mmHg — CL (ref 32.0–45.0)
pO2, Ven: 48 mmHg — ABNORMAL HIGH (ref 32.0–45.0)

## 2018-03-10 LAB — CBC WITH DIFFERENTIAL/PLATELET
ABS IMMATURE GRANULOCYTES: 0.04 10*3/uL (ref 0.00–0.07)
BASOS PCT: 0 %
Basophils Absolute: 0 10*3/uL (ref 0.0–0.1)
EOS ABS: 0 10*3/uL (ref 0.0–0.5)
Eosinophils Relative: 0 %
HCT: 44.4 % (ref 36.0–46.0)
Hemoglobin: 12.5 g/dL (ref 12.0–15.0)
Immature Granulocytes: 1 %
Lymphocytes Relative: 13 %
Lymphs Abs: 0.5 10*3/uL — ABNORMAL LOW (ref 0.7–4.0)
MCH: 27 pg (ref 26.0–34.0)
MCHC: 28.2 g/dL — ABNORMAL LOW (ref 30.0–36.0)
MCV: 95.9 fL (ref 80.0–100.0)
Monocytes Absolute: 0.3 10*3/uL (ref 0.1–1.0)
Monocytes Relative: 9 %
NEUTROS ABS: 3 10*3/uL (ref 1.7–7.7)
NEUTROS PCT: 77 %
Platelets: 160 10*3/uL (ref 150–400)
RBC: 4.63 MIL/uL (ref 3.87–5.11)
RDW: 20.3 % — ABNORMAL HIGH (ref 11.5–15.5)
WBC: 3.9 10*3/uL — ABNORMAL LOW (ref 4.0–10.5)
nRBC: 0.8 % — ABNORMAL HIGH (ref 0.0–0.2)

## 2018-03-10 MED ORDER — SODIUM CHLORIDE 0.9 % IV SOLN
500.0000 mg | INTRAVENOUS | Status: DC
  Administered 2018-03-11 (×2): 500 mg via INTRAVENOUS
  Filled 2018-03-10 (×2): qty 500

## 2018-03-10 MED ORDER — ENOXAPARIN SODIUM 40 MG/0.4ML ~~LOC~~ SOLN
40.0000 mg | Freq: Every day | SUBCUTANEOUS | Status: DC
  Administered 2018-03-11 – 2018-03-15 (×5): 40 mg via SUBCUTANEOUS
  Filled 2018-03-10 (×5): qty 0.4

## 2018-03-10 MED ORDER — SENNOSIDES-DOCUSATE SODIUM 8.6-50 MG PO TABS: 1.0000 | ORAL_TABLET | Freq: Every evening | ORAL | Status: DC | PRN

## 2018-03-10 MED ORDER — IBUPROFEN 400 MG PO TABS: 400.0000 mg | ORAL_TABLET | Freq: Four times a day (QID) | ORAL | Status: DC | PRN

## 2018-03-10 MED ORDER — SODIUM CHLORIDE 0.9% FLUSH
3.0000 mL | Freq: Two times a day (BID) | INTRAVENOUS | Status: DC
  Administered 2018-03-11 – 2018-03-14 (×6): 3 mL via INTRAVENOUS

## 2018-03-10 MED ORDER — MINOCYCLINE HCL 50 MG PO CAPS
50.0000 mg | ORAL_CAPSULE | Freq: Every day | ORAL | Status: DC
  Administered 2018-03-11 – 2018-03-15 (×5): 50 mg via ORAL
  Filled 2018-03-10 (×5): qty 1

## 2018-03-10 MED ORDER — IPRATROPIUM-ALBUTEROL 0.5-2.5 (3) MG/3ML IN SOLN
3.0000 mL | Freq: Four times a day (QID) | RESPIRATORY_TRACT | Status: DC
  Administered 2018-03-10 – 2018-03-11 (×4): 3 mL via RESPIRATORY_TRACT
  Filled 2018-03-10 (×4): qty 3

## 2018-03-10 MED ORDER — IOPAMIDOL (ISOVUE-370) INJECTION 76%
INTRAVENOUS | Status: AC
  Administered 2018-03-10: 100 mL
  Filled 2018-03-10: qty 100

## 2018-03-10 MED ORDER — ASPIRIN EC 81 MG PO TBEC
81.0000 mg | DELAYED_RELEASE_TABLET | Freq: Every day | ORAL | Status: DC
  Administered 2018-03-11 – 2018-03-15 (×5): 81 mg via ORAL
  Filled 2018-03-10 (×5): qty 1

## 2018-03-10 MED ORDER — INSULIN ASPART 100 UNIT/ML ~~LOC~~ SOLN
0.0000 [IU] | SUBCUTANEOUS | Status: DC
  Administered 2018-03-11: 1 [IU] via SUBCUTANEOUS
  Administered 2018-03-11 (×2): 2 [IU] via SUBCUTANEOUS
  Administered 2018-03-11 (×3): 1 [IU] via SUBCUTANEOUS
  Administered 2018-03-12: 3 [IU] via SUBCUTANEOUS
  Administered 2018-03-12 (×3): 2 [IU] via SUBCUTANEOUS

## 2018-03-10 MED ORDER — MOMETASONE FURO-FORMOTEROL FUM 100-5 MCG/ACT IN AERO
2.0000 | INHALATION_SPRAY | Freq: Two times a day (BID) | RESPIRATORY_TRACT | Status: DC
  Administered 2018-03-11 – 2018-03-15 (×9): 2 via RESPIRATORY_TRACT
  Filled 2018-03-10: qty 8.8

## 2018-03-10 MED ORDER — SODIUM CHLORIDE 0.9% FLUSH
3.0000 mL | Freq: Two times a day (BID) | INTRAVENOUS | Status: DC
  Administered 2018-03-12 – 2018-03-15 (×4): 3 mL via INTRAVENOUS

## 2018-03-10 MED ORDER — SODIUM CHLORIDE 0.9% FLUSH: 3.0000 mL | INTRAVENOUS | Status: DC | PRN

## 2018-03-10 MED ORDER — ONDANSETRON HCL 4 MG/2ML IJ SOLN: 4.0000 mg | Freq: Four times a day (QID) | INTRAMUSCULAR | Status: DC | PRN

## 2018-03-10 MED ORDER — ALBUTEROL SULFATE (2.5 MG/3ML) 0.083% IN NEBU: 2.5000 mg | INHALATION_SOLUTION | RESPIRATORY_TRACT | Status: DC | PRN

## 2018-03-10 MED ORDER — METHYLPREDNISOLONE SODIUM SUCC 125 MG IJ SOLR
60.0000 mg | Freq: Four times a day (QID) | INTRAMUSCULAR | Status: DC
  Administered 2018-03-11 – 2018-03-12 (×6): 60 mg via INTRAVENOUS
  Filled 2018-03-10 (×6): qty 2

## 2018-03-10 MED ORDER — ONDANSETRON HCL 4 MG PO TABS: 4.0000 mg | ORAL_TABLET | Freq: Four times a day (QID) | ORAL | Status: DC | PRN

## 2018-03-10 MED ORDER — SODIUM CHLORIDE 0.9 % IV SOLN
250.0000 mL | INTRAVENOUS | Status: DC | PRN
  Administered 2018-03-12: 500 mL via INTRAVENOUS

## 2018-03-10 NOTE — ED Provider Notes (Addendum)
[facility] Provider Note   CSN: [id number] Arrival date & time: 03/10/18  1519     History   Chief Complaint Chief Complaint  Patient presents with  . Respiratory Distress    HPI Aliene AltesDeborah J Shaheed is a 72 y.o. female.  Patient brought in by EMS from home.  Patient been short of breath for several days.  She probably still smokes.  Known to have COPD.  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  Her primary care provider is Adair PatterCatherine Judge which is with [facility] [place].  [facility]  EMS oxygen saturations on room air were 60%.  They gave her Atrovent albuterol inhaler and Solu-Medrol.  Upon arrival here patient was switched over to BiPAP she was started on CPAP on the way in.  Patient was becoming more alert she still was a little drowsy.  Continued here on BiPAP venous blood gas showed an elevation in the carbon dioxide.  Patient's has no family members.  Her friends are her immediate family.  They came in and states there is probably been some falls over the last few days.     Past Medical History:  Diagnosis Date  . Cataract   . Chronic gum disease    gum pockets  . COPD (chronic obstructive pulmonary disease)   . Diabetes mellitus without complication   . Emphysema of lung   . Pneumonia    in past    There are no active problems to display for this patient.   Past Surgical History:  Procedure Laterality Date  . CESAREAN SECTION     1 time  . EYE SURGERY     Bil  . TONSILLECTOMY    . WRIST SURGERY     right wrist     OB History   No obstetric history on file.      Home Medications    Prior to Admission medications   Medication Sig Start Date End Date Taking? Authorizing Provider  albuterol (PROVENTIL) (2.5 MG/3ML) 0.083% nebulizer solution Take 2.5 mg by nebulization every 6 (six) hours as needed for wheezing or shortness of breath.   Yes [provider]  alendronate (FOSAMAX) 70 MG tablet Take 70 mg by mouth once a week. 11/12/17  Yes [provider]  aspirin EC 81 MG tablet Take 81 mg by mouth daily.   Yes [provider]  budesonide-formoterol (SYMBICORT) 80-4.5 MCG/ACT inhaler Inhale 2 puffs into the lungs 2 (two) times daily.   Yes [provider]  Calcium Carb-Cholecalciferol (CALCIUM + D3 PO) Take 1,200 mg by mouth daily.   Yes [provider]  Cholecalciferol (VITAMIN D) 50 MCG (2000 UT) CAPS Take 2,000 Units by mouth daily.   Yes [provider]  metFORMIN (GLUCOPHAGE-XR) 500 MG 24 hr tablet Take 2,000 mg by mouth daily. 09/01/17  Yes [provider]  minocycline (MINOCIN,DYNACIN) 50 MG capsule Take 50 mg by mouth daily.    Yes [provider]  simvastatin (ZOCOR) 20 MG tablet Take 20 mg by mouth at bedtime.    Yes [provider]  TURMERIC PO Take 1,350 mg by mouth daily.   Yes [provider]    Family History Family History  Problem Relation Age of Onset  . Colon cancer Neg Hx   . Esophageal cancer Neg Hx   . Rectal cancer Neg Hx   . Stomach cancer Neg Hx  Social History Social History   Tobacco Use  . Smoking status: Current Every Day Smoker    Packs/day: 1.00    Types: Cigarettes  . Smokeless tobacco: Never Used  Substance Use Topics  . Alcohol use: No  . Drug use: No     Allergies   Penicillins   Review of Systems Review of Systems  Constitutional: Negative for fever.  HENT: Negative for congestion.   Eyes: Negative for redness.  Respiratory: Positive for cough and shortness of breath.   Cardiovascular: Negative for chest pain.  Gastrointestinal: Negative for abdominal pain.  Musculoskeletal: Negative for back pain and neck pain.  Skin: Negative for wound.  Neurological: Negative for headaches.  Hematological: Does not bruise/bleed easily.  Psychiatric/Behavioral: Positive for confusion.     Physical  Exam Updated Vital Signs BP 129/83   Pulse 96   Temp 97.8 F (36.6 C) (Axillary)   Resp (!) 28   Ht 1.549 m (5\' 1" )   Wt 61.2 kg   SpO2 99%   BMI 25.51 kg/m   Physical Exam Constitutional:      General: She is in acute distress.  HENT:     Head: Normocephalic and atraumatic.     Mouth/Throat:     Comments: Mucous membranes dry. Eyes:     Extraocular Movements: Extraocular movements intact.     Conjunctiva/sclera: Conjunctivae normal.     Pupils: Pupils are equal, round, and reactive to light.  Neck:     Musculoskeletal: Neck supple.  Cardiovascular:     Rate and Rhythm: Normal rate.  Pulmonary:     Effort: Respiratory distress present.     Breath sounds: No wheezing or rales.  Chest:     Chest wall: No tenderness.  Abdominal:     General: Bowel sounds are normal.     Palpations: Abdomen is soft.     Tenderness: There is no abdominal tenderness.  Musculoskeletal:     Right lower leg: No edema.     Left lower leg: No edema.     Comments: Right foot with some old bruising and some swelling on the top of the forefoot.  Good cap refill to the toes foot is warm.  No obvious ankle deformity.  No tenderness to the knee or the tibia or femur area.  Good movement of both lower extremities no concerns for any hip injuries.  Skin:    General: Skin is warm.     Capillary Refill: Capillary refill takes less than 2 seconds.  Neurological:     General: No focal deficit present.     Mental Status: She is alert.     Cranial Nerves: No cranial nerve deficit.      ED Treatments / Results  Labs (all labs ordered are listed, but only abnormal results are displayed) Labs Reviewed  CBC WITH DIFFERENTIAL/PLATELET - Abnormal; Notable for the following components:      Result Value   WBC 3.9 (*)    MCHC 28.2 (*)    RDW 20.3 (*)    nRBC 0.8 (*)    Lymphs Abs 0.5 (*)    All other components within normal limits  COMPREHENSIVE METABOLIC PANEL - Abnormal; Notable for the following  components:   Potassium 5.4 (*)    Glucose, Bld 162 (*)    Total Protein 5.9 (*)    Albumin 3.0 (*)    AST 1,928 (*)    ALT 695 (*)    All other components within normal limits  I-STAT VENOUS BLOOD GAS, ED - Abnormal; Notable for the following components:   pH, Ven 7.181 (*)    pCO2, Ven 96.0 (*)    pO2, Ven 26.0 (*)    Bicarbonate 35.9 (*)    TCO2 39 (*)    Acid-Base Excess 4.0 (*)    All other components within normal limits  I-STAT VENOUS BLOOD GAS, ED - Abnormal; Notable for the following components:   pH, Ven 7.244 (*)    pCO2, Ven 84.0 (*)    pO2, Ven 48.0 (*)    Bicarbonate 36.3 (*)    TCO2 39 (*)    Acid-Base Excess 6.0 (*)    All other components within normal limits  I-STAT ARTERIAL BLOOD GAS, ED - Abnormal; Notable for the following components:   pH, Arterial 7.237 (*)    pCO2 arterial 79.2 (*)    Bicarbonate 33.7 (*)    TCO2 36 (*)    Acid-Base Excess 3.0 (*)    All other components within normal limits    EKG EKG Interpretation  Date/Time:  Thursday March 10 2018 15:23:43 EST Ventricular Rate:  102 PR Interval:    QRS Duration: 78 QT Interval:  358 QTC Calculation: 467 R Axis:   95 Text Interpretation:  Sinus tachycardia Probable left atrial enlargement Right axis deviation No previous ECGs available Confirmed by Vanetta Mulders [id number]) on 03/10/2018 4:02:43 PM   Radiology Dg Chest 2 View  Result Date: 03/09/2018 CLINICAL DATA:  Cough, congestion EXAM: CHEST - 2 VIEW COMPARISON:  10/14/2009 FINDINGS: There is hyperinflation of the lungs compatible with COPD. Peribronchial thickening and interstitial prominence. Heart is borderline in size. No effusions or acute bony abnormality. IMPRESSION: COPD. Peribronchial thickening and interstitial prominence could reflect bronchitic changes or interstitial edema. Electronically Signed   By: Charlett Nose M.D.   On: 03/09/2018 09:52   Ct Head Wo Contrast  Result Date: 03/10/2018 CLINICAL DATA:  Fall today.   Head trauma EXAM: CT HEAD WITHOUT CONTRAST TECHNIQUE: Contiguous axial images were obtained from the base of the skull through the vertex without intravenous contrast. COMPARISON:  None. FINDINGS: Brain: Ventricle size normal. Negative for acute infarct, hemorrhage, mass. Mild chronic appearing white matter changes. Punctate calcification right parietal lobe appears benign. Vascular: Negative for hyperdense vessel Skull: Negative for fracture Sinuses/Orbits: Mild mucosal edema paranasal sinuses. Bilateral cataract surgery. Other: None IMPRESSION: No acute abnormality. Electronically Signed   By: Marlan Palau M.D.   On: 03/10/2018 17:37   Dg Chest Port 1 View  Result Date: 03/10/2018 CLINICAL DATA:  Shortness of breath. EXAM: PORTABLE CHEST 1 VIEW COMPARISON:  Radiographs of March 09, 2018. FINDINGS: Stable cardiomediastinal silhouette. Atherosclerosis of thoracic aorta is noted. No pneumothorax or pleural effusion is noted. Stable interstitial densities are noted throughout both lungs which may represent chronic interstitial lung disease, but superimposed edema or inflammation could not be excluded. Bony thorax is unremarkable. IMPRESSION: Stable interstitial densities are noted throughout both lungs which may represent chronic interstitial lung disease or scarring, but superimposed acute edema or inflammation can not be excluded. Aortic Atherosclerosis (ICD10-I70.0). Electronically Signed   By: Lupita Raider, M.D.   On: 03/10/2018 16:09    Procedures Procedures (including critical care time)  CRITICAL CARE Performed by: Vanetta Mulders Total critical care time: 60 minutes Critical care time was exclusive of separately billable procedures and treating other patients. Critical care was necessary to treat or prevent imminent or life-threatening deterioration. Critical care was time spent personally by me on  the following activities: development of treatment plan with patient and/or surrogate as  well as nursing, discussions with consultants, evaluation of patient's response to treatment, examination of patient, obtaining history from patient or surrogate, ordering and performing treatments and interventions, ordering and review of laboratory studies, ordering and review of radiographic studies, pulse oximetry and re-evaluation of patient's condition.   Medications Ordered in ED Medications - No data to display   Initial Impression / Assessment and Plan / ED Course  I have reviewed the triage vital signs and the nursing notes.  Pertinent labs & imaging results that were available during my care of the patient were reviewed by me and considered in my medical decision making (see chart for details).    Patient's venous blood gas has been showing some improvement but still has markedly elevated PaCO2's.  Arterial blood gas was done which does show a markedly elevated PCO2 but patient is mentally alert following commands doing well on BiPAP has become less drowsy over the 5 and half hours that she is been here.  pH is improving.  Do not feel that patient needs to go on ventilator feel that she needs to continue BiPAP.  Feel that patient has pretty significant COPD that has not been followed very carefully.  Patient's primary care provider is Willey Blade at [facility] which is tied in with [facility] in [place].  Will discuss with hospitalist team for admission.  Patient has had some falls.  Will be getting x-ray of her right foot.  Head CT was negative.  We just discovered that she had a lot of old bruising and swelling to the right foot.   In addition I feel that she probably has had high PCO twos for a while and this is the reason why she is so alert at this level.  Respiratory also feels that she does not warrant intubation.  Patient shown a lot of improvement.  I PCO2 like that normally does demand intubation but I think this may be a clinical  exception.  Final Clinical Impressions(s) / ED Diagnoses   Final diagnoses:  COPD exacerbation [facility])    ED Discharge Orders    None       Vanetta Mulders, MD 03/10/18 [id number]    Vanetta Mulders, MD 03/10/18 [id number]    Vanetta Mulders, MD 03/10/18 2116

## 2018-03-10 NOTE — Progress Notes (Signed)
RT transported patient from CT to 4E without any complications.

## 2018-03-10 NOTE — ED Triage Notes (Signed)
Patient from home, has been c/o sob for several days, seen by her PCP and was told to use her albuterol. Friend went to check on her today and found her in respiratory distress with SpO2 of 60%. Hx of COPD. EMS gave 0.5 Atrovent, 5 Albuterol, 1.5 Solu-medrol.

## 2018-03-10 NOTE — ED Notes (Signed)
Patient transported to CT 

## 2018-03-10 NOTE — ED Notes (Signed)
Attempted report, left call back number with Diplomatic Services operational officersecretary.
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
## 2018-03-10 NOTE — Plan of Care (Signed)
  Problem: Education: Goal: Knowledge of General Education information will improve Description Including pain rating scale, medication(s)/side effects and non-pharmacologic comfort measures Outcome: Progressing   

## 2018-03-11 LAB — COMPREHENSIVE METABOLIC PANEL
ALBUMIN: 3 g/dL — AB (ref 3.5–5.0)
ALT: 920 U/L — ABNORMAL HIGH (ref 0–44)
AST: 2893 U/L — AB (ref 15–41)
Alkaline Phosphatase: 69 U/L (ref 38–126)
Anion gap: 10 (ref 5–15)
BUN: 22 mg/dL (ref 8–23)
CHLORIDE: 102 mmol/L (ref 98–111)
CO2: 31 mmol/L (ref 22–32)
Calcium: 9.3 mg/dL (ref 8.9–10.3)
Creatinine, Ser: 0.67 mg/dL (ref 0.44–1.00)
GFR calc Af Amer: >60 mL/min (ref >60)
GFR calc non Af Amer: >60 mL/min (ref >60)
Glucose, Bld: 154 mg/dL — ABNORMAL HIGH (ref 70–99)
Potassium: 5.1 mmol/L (ref 3.5–5.1)
Sodium: 143 mmol/L (ref 135–145)
Total Bilirubin: 0.7 mg/dL (ref 0.3–1.2)
Total Protein: 6.1 g/dL — ABNORMAL LOW (ref 6.5–8.1)

## 2018-03-11 LAB — CBC WITH DIFFERENTIAL/PLATELET
Abs Immature Granulocytes: 0.04 10*3/uL (ref 0.00–0.07)
Basophils Absolute: 0 10*3/uL (ref 0.0–0.1)
Basophils Relative: 0 %
Eosinophils Absolute: 0 10*3/uL (ref 0.0–0.5)
Eosinophils Relative: 0 %
HEMATOCRIT: 42.2 % (ref 36.0–46.0)
Hemoglobin: 11.9 g/dL — ABNORMAL LOW (ref 12.0–15.0)
Immature Granulocytes: 1 %
LYMPHS ABS: 0.6 10*3/uL — AB (ref 0.7–4.0)
LYMPHS PCT: 14 %
MCH: 26.2 pg (ref 26.0–34.0)
MCHC: 28.2 g/dL — ABNORMAL LOW (ref 30.0–36.0)
MCV: 92.7 fL (ref 80.0–100.0)
Monocytes Absolute: 0.2 10*3/uL (ref 0.1–1.0)
Monocytes Relative: 4 %
Neutro Abs: 3.7 10*3/uL (ref 1.7–7.7)
Neutrophils Relative %: 81 %
Platelets: 213 10*3/uL (ref 150–400)
RBC: 4.55 MIL/uL (ref 3.87–5.11)
RDW: 19.8 % — ABNORMAL HIGH (ref 11.5–15.5)
WBC: 4.6 10*3/uL (ref 4.0–10.5)
nRBC: 0.7 % — ABNORMAL HIGH (ref 0.0–0.2)

## 2018-03-11 LAB — GLUCOSE, CAPILLARY
GLUCOSE-CAPILLARY: 175 mg/dL — AB (ref 70–99)
Glucose-Capillary: 125 mg/dL — ABNORMAL HIGH (ref 70–99)
Glucose-Capillary: 136 mg/dL — ABNORMAL HIGH (ref 70–99)
Glucose-Capillary: 139 mg/dL — ABNORMAL HIGH (ref 70–99)
Glucose-Capillary: 147 mg/dL — ABNORMAL HIGH (ref 70–99)
Glucose-Capillary: 151 mg/dL — ABNORMAL HIGH (ref 70–99)

## 2018-03-11 LAB — PROTIME-INR
INR: 1.34
Prothrombin Time: 16.5 seconds — ABNORMAL HIGH (ref 11.4–15.2)

## 2018-03-11 LAB — ACETAMINOPHEN LEVEL: Acetaminophen (Tylenol), Serum: <10 ug/mL — ABNORMAL LOW (ref 10–30)

## 2018-03-11 LAB — PROCALCITONIN: Procalcitonin: 0.1 ng/mL

## 2018-03-11 LAB — AMMONIA: Ammonia: 58 umol/L — ABNORMAL HIGH (ref 9–35)

## 2018-03-11 MED ORDER — SODIUM CHLORIDE 0.9 % IV SOLN
1.0000 g | INTRAVENOUS | Status: DC
  Administered 2018-03-11 – 2018-03-14 (×4): 1 g via INTRAVENOUS
  Filled 2018-03-11 (×3): qty 10
  Filled 2018-03-11: qty 1
  Filled 2018-03-11: qty 10

## 2018-03-11 MED ORDER — SODIUM CHLORIDE 0.9 % IV SOLN: 1.0000 g | INTRAVENOUS | Status: DC

## 2018-03-11 NOTE — Plan of Care (Signed)
  Problem: Education: Goal: Knowledge of General Education information will improve Description Including pain rating scale, medication(s)/side effects and non-pharmacologic comfort measures Outcome: Progressing   Problem: Health Behavior/Discharge Planning: Goal: Ability to manage health-related needs will improve Outcome: Progressing   

## 2018-03-11 NOTE — Progress Notes (Signed)
PROGRESS NOTE    Sheila AltesDeborah J Holmes  ONG:[id number] DOB: Feb 18, 1946 DOA: 03/10/2018 PCP: Loleta DickerJudge, Erin, FNP    Brief Narrative:   Sheila Holmes is a 72 y.o. female with medical history significant for COPD, type 2 diabetes mellitus, and erythrocytosis managed with phlebotomy, now presenting to the emergency department with acute respiratory distress.    Assessment & Plan:   Principal Problem:   COPD with acute exacerbation (HCC) Active Problems:   Acute respiratory failure with hypoxia and hypercapnia (HCC)   Erythrocytosis   High transaminase levels   Diabetes mellitus without complication (HCC)   Leukopenia   Acute respiratory failure with hypoxia and hypercapnia probably secondary to acute COPD exacerbation and bilateral bronchopneumonia. Started her  on IV Solu-Medrol added duo nebs and Dulera. Continue with IV Rocephin and Zithromax.  Sputum cultures and blood cultures are pending Urine for streptococcal antigen pending Nasal cannula oxygen to keep sats greater than 90% Use BiPAP at night as needed Nicotine patch if needed.  Patient currently refusing the nicotine patch.    Deconditioned and multiple falls in the past due to instability. PT and OT evaluations to start tomorrow.    Type 2 diabetes mellitus CBG (last 3)  Recent Labs    03/11/18 0745 03/11/18 1146 03/11/18 1610  GLUCAP 139* 125* 175*   Resume sliding scale insulin.   Mild anemia of chronic disease Recheck CBC tomorrow.    Elevated AST and ALT total bilirubin within normal limits Ultrasound abdomen showed gallbladder stones without any evidence of acute cholecystitis. Patient denies any nausea vomiting or abdominal pain Repeat liver panel tomorrow. Acute hepatitis panel pending.   DVT prophylaxis: Lovenox Code Status: Full code Family Communication: Friends and family at bedside Disposition Plan: Pending clinical improvement Consultants:   None  Procedures:  None Antimicrobials: IV Rocephin and Zithromax  Subjective: Patient reports her breathing has definitely gotten better but she is still requiring 2 to 3 L of nasal cannula oxygen to keep sats greater than 95%.  Patient is not back to baseline yet.  She denies any chest pain, nausea vomiting or abdominal pain.  She denies any headache or dizziness.  Objective: Vitals:   03/11/18 0822 03/11/18 0823 03/11/18 0900 03/11/18 0905  BP:      Pulse:  84 (!) 103 (!) 101  Resp:  19 18 (!) 25  Temp:      TempSrc:      SpO2: 98% 99% 97% 94%  Weight:      Height:       No intake or output data in the 24 hours ending 03/11/18 1259 Filed Weights   03/10/18 1528 03/10/18 2256  Weight: 61.2 kg 60.1 kg    Examination:  General exam: Sitting in bed not in any kind of distress Respiratory system: Scattered wheezing heard posteriorly, tachypneic Cardiovascular system: S1 & S2 heard, RRR. No JVD, murmurs,  Gastrointestinal system: Abdomen is nondistended, soft and nontender. No organomegaly or masses felt. Normal bowel sounds heard. Central nervous system: Alert and oriented. No focal neurological deficits. Extremities: Symmetric 5 x 5 power. Skin: No rashes, lesions or ulcers Psychiatry: Mood & affect appropriate.     Data Reviewed: I have personally reviewed following labs and imaging studies  CBC: Recent Labs  Lab 03/10/18 1500 03/10/18 2351  WBC 3.9* 4.6  NEUTROABS 3.0 3.7  HGB 12.5 11.9*  HCT 44.4 42.2  MCV 95.9 92.7  PLT 160 213   Basic Metabolic Panel: Recent Labs  Lab 03/10/18 1643  03/10/18 2351  NA 141 143  K 5.4* 5.1  CL 105 102  CO2 26 31  GLUCOSE 162* 154*  BUN 22 22  CREATININE 0.68 0.67  CALCIUM 9.5 9.3   GFR: Estimated Creatinine Clearance: 51.8 mL/min (by C-G formula based on SCr of 0.67 mg/dL). Liver Function Tests: Recent Labs  Lab 03/10/18 1643 03/10/18 2351  AST 1,928* 2,893*  ALT 695* 920*  ALKPHOS 71 69  BILITOT 0.6 0.7  PROT 5.9* 6.1*   ALBUMIN 3.0* 3.0*   No results for input(s): LIPASE, AMYLASE in the last 168 hours. Recent Labs  Lab 03/10/18 2350  AMMONIA 58*   Coagulation Profile: Recent Labs  Lab 03/10/18 2350  INR 1.34   Cardiac Enzymes: No results for input(s): CKTOTAL, CKMB, CKMBINDEX, TROPONINI in the last 168 hours. BNP (last 3 results) No results for input(s): PROBNP in the last 8760 hours. HbA1C: No results for input(s): HGBA1C in the last 72 hours. CBG: Recent Labs  Lab 03/11/18 0024 03/11/18 0449 03/11/18 0745 03/11/18 1146  GLUCAP 151* 136* 139* 125*   Lipid Profile: No results for input(s): CHOL, HDL, LDLCALC, TRIG, CHOLHDL, LDLDIRECT in the last 72 hours. Thyroid Function Tests: No results for input(s): TSH, T4TOTAL, FREET4, T3FREE, THYROIDAB in the last 72 hours. Anemia Panel: No results for input(s): VITAMINB12, FOLATE, FERRITIN, TIBC, IRON, RETICCTPCT in the last 72 hours. Sepsis Labs: Recent Labs  Lab 03/10/18 2350  PROCALCITON 0.10    No results found for this or any previous visit (from the past 240 hour(s)).       Radiology Studies: Ct Head Wo Contrast  Result Date: 03/10/2018 CLINICAL DATA:  Fall today.  Head trauma EXAM: CT HEAD WITHOUT CONTRAST TECHNIQUE: Contiguous axial images were obtained from the base of the skull through the vertex without intravenous contrast. COMPARISON:  None. FINDINGS: Brain: Ventricle size normal. Negative for acute infarct, hemorrhage, mass. Mild chronic appearing white matter changes. Punctate calcification right parietal lobe appears benign. Vascular: Negative for hyperdense vessel Skull: Negative for fracture Sinuses/Orbits: Mild mucosal edema paranasal sinuses. Bilateral cataract surgery. Other: None IMPRESSION: No acute abnormality. Electronically Signed   By: Marlan Palau M.D.   On: 03/10/2018 17:37   Ct Angio Chest Pe W Or Wo Contrast  Result Date: 03/10/2018 CLINICAL DATA:  Acute respiratory failure. COPD. Swelling and  tenderness of the right leg. Recent 8 hour train ride. EXAM: CT ANGIOGRAPHY CHEST WITH CONTRAST TECHNIQUE: Multidetector CT imaging of the chest was performed using the standard protocol during bolus administration of intravenous contrast. Multiplanar CT image reconstructions and MIPs were obtained to evaluate the vascular anatomy. CONTRAST:  ISOVUE-370 IOPAMIDOL (ISOVUE-370) INJECTION 76% COMPARISON:  None. FINDINGS: Cardiovascular: Good opacification of the central and segmental pulmonary arteries. No focal filling defects. No evidence of significant pulmonary embolus. Mild enlargement of the main pulmonary artery may reflect pulmonary arterial hypertension in the appropriate clinical setting. Normal caliber thoracic aorta with scattered calcification. No aortic dissection. Great vessel origins are patent. Normal heart size. No pericardial effusions. Calcification in the mitral valve annulus and coronary arteries. Mediastinum/Nodes: Esophagus is decompressed. Mild prominence of mediastinal lymph nodes diffusely without pathologic enlargement, likely reactive. Lungs/Pleura: Motion artifact limits examination. Minimal bilateral pleural effusions with basilar atelectasis. Suggestion of patchy tree-in-bud infiltrates in the upper lungs bilaterally suggesting bronchopneumonia or airways disease. Airways are patent without evidence of mucous plugging. No pneumothorax. Upper Abdomen: Bilateral adrenal gland nodules with low-attenuation change suggesting benign adenomas. Musculoskeletal: Degenerative changes in the spine. Mild superior  endplate depression at a midthoracic vertebra, likely chronic. Review of the MIP images confirms the above findings. IMPRESSION: 1. No evidence of significant pulmonary embolus. 2. Minimal bilateral pleural effusions with basilar atelectasis. 3. Patchy tree-in-bud infiltrates in the upper lungs bilaterally suggesting bronchopneumonia or airways disease. 4. Bilateral adrenal gland  nodules with low-attenuation change suggesting benign adenomas. Aortic Atherosclerosis (ICD10-I70.0). Electronically Signed   By: Burman NievesWilliam  Stevens M.D.   On: 03/10/2018 22:52   Dg Chest Port 1 View  Result Date: 03/10/2018 CLINICAL DATA:  Shortness of breath. EXAM: PORTABLE CHEST 1 VIEW COMPARISON:  Radiographs of March 09, 2018. FINDINGS: Stable cardiomediastinal silhouette. Atherosclerosis of thoracic aorta is noted. No pneumothorax or pleural effusion is noted. Stable interstitial densities are noted throughout both lungs which may represent chronic interstitial lung disease, but superimposed edema or inflammation could not be excluded. Bony thorax is unremarkable. IMPRESSION: Stable interstitial densities are noted throughout both lungs which may represent chronic interstitial lung disease or scarring, but superimposed acute edema or inflammation can not be excluded. Aortic Atherosclerosis (ICD10-I70.0). Electronically Signed   By: Lupita RaiderJames  Green Jr, M.D.   On: 03/10/2018 16:09   Dg Foot Complete Right  Result Date: 03/10/2018 CLINICAL DATA:  Right foot pain EXAM: RIGHT FOOT COMPLETE - 3+ VIEW COMPARISON:  CT right foot 04/22/2012 FINDINGS: Deformity of the head of the right first metatarsal is likely related to remote trauma. No acute fracture or dislocation. Bone density is normal. Mild soft tissue swelling without soft tissue gas. IMPRESSION: No acute fracture or dislocation. Electronically Signed   By: Deatra RobinsonKevin  Herman M.D.   On: 03/10/2018 21:51   [place] Abdomen Limited Ruq  Result Date: 03/11/2018 CLINICAL DATA:  Elevated transaminase levels EXAM: ULTRASOUND ABDOMEN LIMITED RIGHT UPPER QUADRANT COMPARISON:  None. FINDINGS: Gallbladder: There is a 3 mm non mobile gallstone at the gallbladder neck. No gallbladder wall thickening or pericholecystic fluid. A negative sonographic Eulah PontMurphy sign was reported by the sonographer. Common bile duct: Diameter: 1 mm Liver: No focal lesion identified. Within  normal limits in parenchymal echogenicity. Portal vein is patent on color Doppler imaging with normal direction of blood flow towards the liver. IMPRESSION: Cholelithiasis without other evidence of acute cholecystitis. No biliary dilatation. Electronically Signed   By: Deatra RobinsonKevin  Herman M.D.   On: 03/11/2018 01:45        Scheduled Meds: . aspirin EC  81 mg Oral Daily  . enoxaparin (LOVENOX) injection  40 mg Subcutaneous Daily  . insulin aspart  0-9 Units Subcutaneous Q4H  . ipratropium-albuterol  3 mL Nebulization Q6H  . methylPREDNISolone (SOLU-MEDROL) injection  60 mg Intravenous Q6H  . minocycline  50 mg Oral Daily  . mometasone-formoterol  2 puff Inhalation BID  . sodium chloride flush  3 mL Intravenous Q12H  . sodium chloride flush  3 mL Intravenous Q12H   Continuous Infusions: . sodium chloride    . azithromycin 500 mg (03/11/18 0152)  . [START ON 03/12/2018] cefTRIAXone (ROCEPHIN)  IV       LOS: 1 day    Time spent: 2532    Kathlen ModyVijaya Meaghann Choo, MD [facility] Pager [phone]  If 7PM-7AM, please contact night-coverage [email or web address] Password [email or web address] 03/11/2018, 12:59 PM

## 2018-03-12 LAB — CBC
HCT: 42.1 % (ref 36.0–46.0)
Hemoglobin: 12 g/dL (ref 12.0–15.0)
MCH: 27 pg (ref 26.0–34.0)
MCHC: 28.5 g/dL — ABNORMAL LOW (ref 30.0–36.0)
MCV: 94.6 fL (ref 80.0–100.0)
Platelets: 204 10*3/uL (ref 150–400)
RBC: 4.45 MIL/uL (ref 3.87–5.11)
RDW: 19.8 % — ABNORMAL HIGH (ref 11.5–15.5)
WBC: 8.2 10*3/uL (ref 4.0–10.5)
nRBC: 0.8 % — ABNORMAL HIGH (ref 0.0–0.2)

## 2018-03-12 LAB — COMPREHENSIVE METABOLIC PANEL WITH GFR
ALT: 723 U/L — ABNORMAL HIGH (ref 0–44)
AST: 973 U/L — ABNORMAL HIGH (ref 15–41)
Albumin: 2.8 g/dL — ABNORMAL LOW (ref 3.5–5.0)
Alkaline Phosphatase: 63 U/L (ref 38–126)
Anion gap: 7 (ref 5–15)
BUN: 22 mg/dL (ref 8–23)
CO2: 34 mmol/L — ABNORMAL HIGH (ref 22–32)
Calcium: 8.9 mg/dL (ref 8.9–10.3)
Chloride: 101 mmol/L (ref 98–111)
Creatinine, Ser: 0.49 mg/dL (ref 0.44–1.00)
GFR calc Af Amer: >60 mL/min
GFR calc non Af Amer: >60 mL/min
Glucose, Bld: 172 mg/dL — ABNORMAL HIGH (ref 70–99)
Potassium: 5.2 mmol/L — ABNORMAL HIGH (ref 3.5–5.1)
Sodium: 142 mmol/L (ref 135–145)
Total Bilirubin: 0.3 mg/dL (ref 0.3–1.2)
Total Protein: 6 g/dL — ABNORMAL LOW (ref 6.5–8.1)

## 2018-03-12 LAB — GLUCOSE, CAPILLARY
GLUCOSE-CAPILLARY: 201 mg/dL — AB (ref 70–99)
Glucose-Capillary: 146 mg/dL — ABNORMAL HIGH (ref 70–99)
Glucose-Capillary: 157 mg/dL — ABNORMAL HIGH (ref 70–99)
Glucose-Capillary: 166 mg/dL — ABNORMAL HIGH (ref 70–99)
Glucose-Capillary: 190 mg/dL — ABNORMAL HIGH (ref 70–99)
Glucose-Capillary: 206 mg/dL — ABNORMAL HIGH (ref 70–99)

## 2018-03-12 LAB — HEPATITIS PANEL, ACUTE
HCV Ab: <0.1 s/co ratio (ref 0.0–0.9)
Hep A IgM: NEGATIVE
Hep B C IgM: NEGATIVE
Hepatitis B Surface Ag: NEGATIVE

## 2018-03-12 LAB — HIV ANTIBODY (ROUTINE TESTING W REFLEX): HIV SCREEN 4TH GENERATION: NONREACTIVE

## 2018-03-12 MED ORDER — AZITHROMYCIN 500 MG PO TABS
500.0000 mg | ORAL_TABLET | Freq: Every day | ORAL | Status: DC
  Administered 2018-03-12 – 2018-03-14 (×3): 500 mg via ORAL
  Filled 2018-03-12 (×3): qty 1

## 2018-03-12 MED ORDER — INSULIN ASPART 100 UNIT/ML ~~LOC~~ SOLN
0.0000 [IU] | Freq: Three times a day (TID) | SUBCUTANEOUS | Status: DC
  Administered 2018-03-12: 3 [IU] via SUBCUTANEOUS
  Administered 2018-03-12: 1 [IU] via SUBCUTANEOUS
  Administered 2018-03-13: 2 [IU] via SUBCUTANEOUS
  Administered 2018-03-13 (×2): 3 [IU] via SUBCUTANEOUS
  Administered 2018-03-14 (×2): 2 [IU] via SUBCUTANEOUS
  Administered 2018-03-14: 1 [IU] via SUBCUTANEOUS
  Administered 2018-03-14 – 2018-03-15 (×2): 2 [IU] via SUBCUTANEOUS

## 2018-03-12 MED ORDER — GLUCERNA SHAKE PO LIQD
237.0000 mL | Freq: Two times a day (BID) | ORAL | Status: DC
  Administered 2018-03-13 – 2018-03-15 (×4): 237 mL via ORAL

## 2018-03-12 MED ORDER — IPRATROPIUM-ALBUTEROL 0.5-2.5 (3) MG/3ML IN SOLN
3.0000 mL | Freq: Three times a day (TID) | RESPIRATORY_TRACT | Status: DC
  Administered 2018-03-12: 3 mL via RESPIRATORY_TRACT
  Filled 2018-03-12: qty 3

## 2018-03-12 MED ORDER — WHITE PETROLATUM EX OINT
TOPICAL_OINTMENT | CUTANEOUS | Status: AC
  Filled 2018-03-12: qty 28.35

## 2018-03-12 MED ORDER — METHYLPREDNISOLONE SODIUM SUCC 125 MG IJ SOLR
60.0000 mg | Freq: Two times a day (BID) | INTRAMUSCULAR | Status: DC
  Administered 2018-03-12 – 2018-03-14 (×4): 60 mg via INTRAVENOUS
  Filled 2018-03-12 (×4): qty 2

## 2018-03-12 MED ORDER — IPRATROPIUM-ALBUTEROL 0.5-2.5 (3) MG/3ML IN SOLN
3.0000 mL | Freq: Two times a day (BID) | RESPIRATORY_TRACT | Status: DC
  Administered 2018-03-12 – 2018-03-15 (×6): 3 mL via RESPIRATORY_TRACT
  Filled 2018-03-12 (×6): qty 3

## 2018-03-12 MED ORDER — SODIUM POLYSTYRENE SULFONATE 15 GM/60ML PO SUSP
30.0000 g | Freq: Once | ORAL | Status: AC
  Administered 2018-03-12: 30 g via ORAL
  Filled 2018-03-12: qty 120

## 2018-03-12 NOTE — Progress Notes (Signed)
Nutrition Brief Note  RD consulted as part of COPD gold protocol  Wt Readings from Last 15 Encounters:  03/10/18 60.1 kg  11/10/13 74.8 kg  10/31/13 75.2 kg   Pt cannot provide much information d/t confusion/AMS. Can not seem to remember anything and will often contradict what she had said a few seconds prior.  She has great difficulty with word finding and uses frequent malapropisms.   She does report a good appetite. She says she has lost weight, but this seems to be far in the past. Per chart and Care Everywhere, patient was weighed at 130.5 lbs and 129.5 lbs at her office visits on 10/1 and 9/1 respectively. She is currently 132.5 lbs.   Reviewing meal documentation, she appears to be eating well. She has eaten 50-75% of her meals for the past 2 days. To assure she meets her kcal/pro needs will order oral supplements. She says "I dont think protein is my problem, think a lack of vegetables is", but agreed to them.   No nutrition interventions warranted at this time. If nutrition issues arise, please consult RD.   Sheila LouisNathan Holmes RD, LDN, CNSC Clinical Nutrition Available Tues-Sat via Pager: [phone] 03/12/2018 8:08 PM

## 2018-03-12 NOTE — Evaluation (Signed)
Physical Therapy Evaluation Patient Details Name: Sheila AltesDeborah J Holmes MRN: [id number] DOB: [birth year] Today's Date: 03/12/2018   History of Present Illness  Pt is a 72 y.o. female admitted 03/10/18 with respiratory distress; worked up for COPD exacerbation. PMH includes COPD, emphysema, DM, cataract.    Clinical Impression  Pt presents with an overall decrease in functional mobility secondary to above. PTA, pt indep and lives alone; endorses falls in the past three months.Today, pt ambulatory with RW at supervision-level; required 3L O2 [place] to maintain SpO2 >88% (see SATURATIONS QUALIFICATIONS NOTE). Pt considering transition to ALF; increased time spent discussing this for her. Until this occurs, recommend follow-up with HHPT services and use of RW; pt reports son may be able to provide initial 24/7 supervision. Will follow acutely to address established goals.     Follow Up Recommendations Home health PT;Supervision - Intermittent    Equipment Recommendations  Rolling walker with 5" wheels    Recommendations for Other Services       Precautions / Restrictions Precautions Precautions: Fall Precaution Comments: Watch SpO2 Restrictions Weight Bearing Restrictions: No      Mobility  Bed Mobility               General bed mobility comments: Received sitting in recliner  Transfers Overall transfer level: Needs assistance Equipment used: None;Rolling walker (2 wheeled) Transfers: Sit to/from Stand Sit to Stand: Supervision            Ambulation/Gait Ambulation/Gait assistance: Supervision Gait Distance (Feet): 350 Feet Assistive device: Rolling walker (2 wheeled) Gait Pattern/deviations: Step-through pattern;Decreased stride length Gait velocity: Decreased Gait velocity interpretation: 1.31 - 2.62 ft/sec, indicative of limited community ambulator General Gait Details: Slow, steady amb with RW and supervision for safety. SpO2 down to 78% on RA; maintained >90% on 3L  O2 [name]  Stairs            Wheelchair Mobility    Modified Rankin (Stroke Patients Only)       Balance Overall balance assessment: Needs assistance   Sitting balance-Leahy Scale: Good       Standing balance-Leahy Scale: Fair Standing balance comment: Can static stand without UE support; dynamic stability improved with UE support                             Pertinent Vitals/Pain Pain Assessment: No/denies pain    Home Living Family/patient expects to be discharged to:: Private residence Living Arrangements: Alone Available Help at Discharge: Family;Friend(s);Available PRN/intermittently Type of Home: House Home Access: Ramped entrance     Home Layout: One level Home Equipment: Bedside commode;Shower seat      Prior Function Level of Independence: Independent         Comments: Does not wear home O2     Hand Dominance        Extremity/Trunk Assessment   Upper Extremity Assessment Upper Extremity Assessment: Overall WFL for tasks assessed    Lower Extremity Assessment Lower Extremity Assessment: Overall WFL for tasks assessed       Communication   Communication: No difficulties  Cognition Arousal/Alertness: Awake/alert Behavior During Therapy: WFL for tasks assessed/performed Overall Cognitive Status: Within Functional Limits for tasks assessed                                 General Comments: WFL for simple tasks. Tangential with speech/decreased attention, requiring intermittent cues to stay  on conversation topic/answer question      General Comments General comments (skin integrity, edema, etc.): Friends present at beginning and end of session    Exercises     Assessment/Plan    PT Assessment Patient needs continued PT services  PT Problem List Decreased activity tolerance;Decreased balance;Decreased mobility;Decreased knowledge of use of DME;Cardiopulmonary status limiting activity       PT Treatment  Interventions DME instruction;Gait training;Stair training;Functional mobility training;Therapeutic activities;Therapeutic exercise;Balance training;Patient/family education    PT Goals (Current goals can be found in the Care Plan section)  Acute Rehab PT Goals Patient Stated Goal: Decide whether she should move to new condo she is about to close on or to ALF PT Goal Formulation: With patient Time For Goal Achievement: 03/26/18 Potential to Achieve Goals: Good    Frequency Min 3X/week   Barriers to discharge Decreased caregiver support      Co-evaluation               AM-PAC PT "6 Clicks" Mobility  Outcome Measure Help needed turning from your back to your side while in a flat bed without using bedrails?: None Help needed moving from lying on your back to sitting on the side of a flat bed without using bedrails?: None Help needed moving to and from a bed to a chair (including a wheelchair)?: None Help needed standing up from a chair using your arms (e.g., wheelchair or bedside chair)?: A Little Help needed to walk in hospital room?: A Little Help needed climbing 3-5 steps with a railing? : A Little 6 Click Score: 21    End of Session Equipment Utilized During Treatment: Gait belt Activity Tolerance: Patient tolerated treatment well Patient left: in chair;with call bell/phone within reach;with family/visitor present Nurse Communication: Mobility status PT Visit Diagnosis: Other abnormalities of gait and mobility (R26.89);Repeated falls (R29.6)    Time: [id number] PT Time Calculation (min) (ACUTE ONLY): 28 min   Charges:   PT Evaluation $PT Eval Moderate Complexity: 1 Mod PT Treatments $Gait Training: 8-22 mins      Ina HomesJaclyn Damoni Causby, PT, DPT Acute Rehabilitation Services  Pager [phone] Office [phone]  Malachy ChamberJaclyn L Shirlyn Savin 03/12/2018, 3:18 PM

## 2018-03-12 NOTE — Progress Notes (Signed)
PROGRESS NOTE    Sheila AltesDeborah J Nordin  ZOX:[id number] DOB: 21-Mar-1946 DOA: 03/10/2018 PCP: Loleta DickerJudge, Erin, FNP    Brief Narrative:   Sheila Holmes is a 72 y.o. female with medical history significant for COPD, type 2 diabetes mellitus, and erythrocytosis managed with phlebotomy, now presenting to the emergency department with acute respiratory distress.    Assessment & Plan:   Principal Problem:   COPD with acute exacerbation (HCC) Active Problems:   Acute respiratory failure with hypoxia and hypercapnia (HCC)   Erythrocytosis   High transaminase levels   Diabetes mellitus without complication (HCC)   Leukopenia   Acute respiratory failure with hypoxia and hypercapnia probably secondary to acute COPD exacerbation and bilateral bronchopneumonia. Improving, patient did not use any BiPAP overnight, she is currently on 3 L of nasal cannula oxygen. Start tapering IV steroids, continue with duo nebs and Dulera. Continue with IV Rocephin and Zithromax.  Sputum cultures and blood cultures are pending Urine for streptococcal antigen pending Nasal cannula oxygen to keep sats greater than 90% Nicotine patch if needed.  Patient currently refusing the nicotine patch.    Deconditioned and multiple falls in the past due to instability. PT and OT evaluations pending    Type 2 diabetes mellitus CBG (last 3)  Recent Labs    03/12/18 0733 03/12/18 1200 03/12/18 1634  GLUCAP 166* 201* 146*   Resume sliding scale insulin.   Mild anemia of chronic disease  Hemoglobin stable at 12    Elevated AST and ALT total bilirubin within normal limits Ultrasound abdomen showed gallbladder stones without any evidence of acute cholecystitis. Patient denies any nausea vomiting or abdominal pain Liver enzymes have improved Acute hepatitis panel negative   Hyperkalemia Kayexalate ordered.  She is asymptomatic. Check potassium tomorrow   DVT prophylaxis: Lovenox Code Status: Full  code Family Communication: Son at bedside Disposition Plan: Pending clinical improvement Consultants:   None  Procedures: None Antimicrobials: IV Rocephin and Zithromax  Subjective: Patient's oxygen saturations dropped to 81% on room air.  She denies any chest pain nausea or vomiting. Her breathing has improved.  But she has is not back to her baseline yet.  Objective: Vitals:   03/12/18 0740 03/12/18 0800 03/12/18 1158 03/12/18 1200  BP:  109/84 109/67 109/67  Pulse:  95 94 (!) 107  Resp:  (!) 27 (!) 28 (!) 25  Temp:   98.6 F (37 C)   TempSrc:   Oral   SpO2: 99% 95% 91%   Weight:      Height:        Intake/Output Summary (Last 24 hours) at 03/12/2018 1721 Last data filed at 03/12/2018 1400 Gross per 24 hour  Intake 645.2 ml  Output 502 ml  Net 143.2 ml   Filed Weights   03/10/18 1528 03/10/18 2256  Weight: 61.2 kg 60.1 kg    Examination:  General exam: Sitting in chair alert, not in any kind of distress Respiratory system: Continues to be Tachypneic, air entry fair, scattered wheezing present Cardiovascular system: S1 & S2 heard, RRR. No JVD, murmurs,  Gastrointestinal system: Abdomen is nondistended, soft, nontender, bowel sounds are good Central nervous system: Alert and oriented. No focal neurological deficits. Extremities: Symmetric 5 x 5 power. Skin: No rashes, lesions or ulcers Psychiatry: Mood & affect appropriate.     Data Reviewed: I have personally reviewed following labs and imaging studies  CBC: Recent Labs  Lab 03/10/18 1500 03/10/18 2351 03/12/18 0253  WBC 3.9* 4.6 8.2  NEUTROABS  3.0 3.7  --   HGB 12.5 11.9* 12.0  HCT 44.4 42.2 42.1  MCV 95.9 92.7 94.6  PLT 160 213 204   Basic Metabolic Panel: Recent Labs  Lab 03/10/18 1643 03/10/18 2351 03/12/18 0253  NA 141 143 142  K 5.4* 5.1 5.2*  CL 105 102 101  CO2 26 31 34*  GLUCOSE 162* 154* 172*  BUN 22 22 22   CREATININE 0.68 0.67 0.49  CALCIUM 9.5 9.3 8.9   GFR: Estimated  Creatinine Clearance: 51.8 mL/min (by C-G formula based on SCr of 0.49 mg/dL). Liver Function Tests: Recent Labs  Lab 03/10/18 1643 03/10/18 2351 03/12/18 0253  AST 1,928* 2,893* 973*  ALT 695* 920* 723*  ALKPHOS 71 69 63  BILITOT 0.6 0.7 0.3  PROT 5.9* 6.1* 6.0*  ALBUMIN 3.0* 3.0* 2.8*   No results for input(s): LIPASE, AMYLASE in the last 168 hours. Recent Labs  Lab 03/10/18 2350  AMMONIA 58*   Coagulation Profile: Recent Labs  Lab 03/10/18 2350  INR 1.34   Cardiac Enzymes: No results for input(s): CKTOTAL, CKMB, CKMBINDEX, TROPONINI in the last 168 hours. BNP (last 3 results) No results for input(s): PROBNP in the last 8760 hours. HbA1C: No results for input(s): HGBA1C in the last 72 hours. CBG: Recent Labs  Lab 03/11/18 2357 03/12/18 0414 03/12/18 0733 03/12/18 1200 03/12/18 1634  GLUCAP 190* 157* 166* 201* 146*   Lipid Profile: No results for input(s): CHOL, HDL, LDLCALC, TRIG, CHOLHDL, LDLDIRECT in the last 72 hours. Thyroid Function Tests: No results for input(s): TSH, T4TOTAL, FREET4, T3FREE, THYROIDAB in the last 72 hours. Anemia Panel: No results for input(s): VITAMINB12, FOLATE, FERRITIN, TIBC, IRON, RETICCTPCT in the last 72 hours. Sepsis Labs: Recent Labs  Lab 03/10/18 2350  PROCALCITON 0.10    No results found for this or any previous visit (from the past 240 hour(s)).       Radiology Studies: Ct Head Wo Contrast  Result Date: 03/10/2018 CLINICAL DATA:  Fall today.  Head trauma EXAM: CT HEAD WITHOUT CONTRAST TECHNIQUE: Contiguous axial images were obtained from the base of the skull through the vertex without intravenous contrast. COMPARISON:  None. FINDINGS: Brain: Ventricle size normal. Negative for acute infarct, hemorrhage, mass. Mild chronic appearing white matter changes. Punctate calcification right parietal lobe appears benign. Vascular: Negative for hyperdense vessel Skull: Negative for fracture Sinuses/Orbits: Mild mucosal edema  paranasal sinuses. Bilateral cataract surgery. Other: None IMPRESSION: No acute abnormality. Electronically Signed   By: Marlan Palauharles  Clark M.D.   On: 03/10/2018 17:37   Ct Angio Chest Pe W Or Wo Contrast  Result Date: 03/10/2018 CLINICAL DATA:  Acute respiratory failure. COPD. Swelling and tenderness of the right leg. Recent 8 hour train ride. EXAM: CT ANGIOGRAPHY CHEST WITH CONTRAST TECHNIQUE: Multidetector CT imaging of the chest was performed using the standard protocol during bolus administration of intravenous contrast. Multiplanar CT image reconstructions and MIPs were obtained to evaluate the vascular anatomy. CONTRAST:  100mL ISOVUE-370 IOPAMIDOL (ISOVUE-370) INJECTION 76% COMPARISON:  None. FINDINGS: Cardiovascular: Good opacification of the central and segmental pulmonary arteries. No focal filling defects. No evidence of significant pulmonary embolus. Mild enlargement of the main pulmonary artery may reflect pulmonary arterial hypertension in the appropriate clinical setting. Normal caliber thoracic aorta with scattered calcification. No aortic dissection. Great vessel origins are patent. Normal heart size. No pericardial effusions. Calcification in the mitral valve annulus and coronary arteries. Mediastinum/Nodes: Esophagus is decompressed. Mild prominence of mediastinal lymph nodes diffusely without pathologic enlargement, likely reactive.  Lungs/Pleura: Motion artifact limits examination. Minimal bilateral pleural effusions with basilar atelectasis. Suggestion of patchy tree-in-bud infiltrates in the upper lungs bilaterally suggesting bronchopneumonia or airways disease. Airways are patent without evidence of mucous plugging. No pneumothorax. Upper Abdomen: Bilateral adrenal gland nodules with low-attenuation change suggesting benign adenomas. Musculoskeletal: Degenerative changes in the spine. Mild superior endplate depression at a midthoracic vertebra, likely chronic. Review of the MIP images  confirms the above findings. IMPRESSION: 1. No evidence of significant pulmonary embolus. 2. Minimal bilateral pleural effusions with basilar atelectasis. 3. Patchy tree-in-bud infiltrates in the upper lungs bilaterally suggesting bronchopneumonia or airways disease. 4. Bilateral adrenal gland nodules with low-attenuation change suggesting benign adenomas. Aortic Atherosclerosis (ICD10-I70.0). Electronically Signed   By: Burman Nieves M.D.   On: 03/10/2018 22:52   Dg Foot Complete Right  Result Date: 03/10/2018 CLINICAL DATA:  Right foot pain EXAM: RIGHT FOOT COMPLETE - 3+ VIEW COMPARISON:  CT right foot 04/22/2012 FINDINGS: Deformity of the head of the right first metatarsal is likely related to remote trauma. No acute fracture or dislocation. Bone density is normal. Mild soft tissue swelling without soft tissue gas. IMPRESSION: No acute fracture or dislocation. Electronically Signed   By: Deatra Robinson M.D.   On: 03/10/2018 21:51   US Abdomen Limited Ruq  Result Date: 03/11/2018 CLINICAL DATA:  Elevated transaminase levels EXAM: ULTRASOUND ABDOMEN LIMITED RIGHT UPPER QUADRANT COMPARISON:  None. FINDINGS: Gallbladder: There is a 3 mm non mobile gallstone at the gallbladder neck. No gallbladder wall thickening or pericholecystic fluid. A negative sonographic Eulah Pont sign was reported by the sonographer. Common bile duct: Diameter: 1 mm Liver: No focal lesion identified. Within normal limits in parenchymal echogenicity. Portal vein is patent on color Doppler imaging with normal direction of blood flow towards the liver. IMPRESSION: Cholelithiasis without other evidence of acute cholecystitis. No biliary dilatation. Electronically Signed   By: Deatra Robinson M.D.   On: 03/11/2018 01:45        Scheduled Meds: . aspirin EC  81 mg Oral Daily  . azithromycin  500 mg Oral QHS  . enoxaparin (LOVENOX) injection  40 mg Subcutaneous Daily  . insulin aspart  0-9 Units Subcutaneous TID AC & HS  .  ipratropium-albuterol  3 mL Nebulization BID  . methylPREDNISolone (SOLU-MEDROL) injection  60 mg Intravenous Q12H  . minocycline  50 mg Oral Daily  . mometasone-formoterol  2 puff Inhalation BID  . sodium chloride flush  3 mL Intravenous Q12H  . sodium chloride flush  3 mL Intravenous Q12H   Continuous Infusions: . sodium chloride Stopped (03/12/18 1344)  . cefTRIAXone (ROCEPHIN)  IV 100 mL/hr at 03/12/18 1400     LOS: 2 days    Time spent: 30 minutes    Kathlen Mody, MD [facility] Pager [phone]  If 7PM-7AM, please contact night-coverage [email or web address] Password [facility] 03/12/2018, 5:21 PM

## 2018-03-12 NOTE — Progress Notes (Signed)
SATURATION QUALIFICATIONS: (This note is used to comply with regulatory documentation for home oxygen)  Patient Saturations on Room Air at Rest = 81%  Patient Saturations on Room Air while Ambulating = 78%  Patient Saturations on 3 Liters of oxygen while Ambulating = 91%  Please briefly explain why patient needs home oxygen: Pt requires supplemental oxygen to maintain SpO2 >88% at rest and with mobility.  Ina HomesJaclyn Sharmeka Palmisano, PT, DPT Acute Rehabilitation Services  Pager [phone] Office [phone]

## 2018-03-12 NOTE — Progress Notes (Addendum)
SATURATION QUALIFICATIONS: (This note is used to comply with regulatory documentation for home oxygen)  Patient Saturations on Room Air at Rest = 83% Patient Saturations on Room Air while Ambulating( just to bed side commode) = 83% Patient Saturations on 4 Liters of oxygen while Ambulating = 95-97%  Please briefly explain why patient needs home oxygen: Patient almost immediately dropped to 86% when removing oxygen to go to bed side commode, then preceded to go to 83% on room air. Patient placed on 3 L then  4 L and it took a few minutes and several deep breaths for oxygen saturation to increase over 90. Will monitor patient. Jeffrie Stander, Randall AnKristin Jessup RN

## 2018-03-12 NOTE — Progress Notes (Signed)
Patient assisted to chair from bed. Gait unsteady, pt on 3 L [place] pt sitting in chair and oxygen sats 92-94 on 3L [place]. Will monitor patient. Sheila Holmes, Randall AnKristin Jessup RN

## 2018-03-13 LAB — GLUCOSE, CAPILLARY
Glucose-Capillary: 172 mg/dL — ABNORMAL HIGH (ref 70–99)
Glucose-Capillary: 220 mg/dL — ABNORMAL HIGH (ref 70–99)
Glucose-Capillary: 218 mg/dL — ABNORMAL HIGH (ref 70–99)
Glucose-Capillary: 200 mg/dL — ABNORMAL HIGH (ref 70–99)
Glucose-Capillary: 162 mg/dL — ABNORMAL HIGH (ref 70–99)

## 2018-03-13 LAB — COMPREHENSIVE METABOLIC PANEL
ALK PHOS: 58 U/L (ref 38–126)
ALT: 429 U/L — ABNORMAL HIGH (ref 0–44)
ANION GAP: 6 (ref 5–15)
AST: 220 U/L — ABNORMAL HIGH (ref 15–41)
Albumin: 2.9 g/dL — ABNORMAL LOW (ref 3.5–5.0)
BILIRUBIN TOTAL: 0.8 mg/dL (ref 0.3–1.2)
BUN: 22 mg/dL (ref 8–23)
CO2: 37 mmol/L — ABNORMAL HIGH (ref 22–32)
Calcium: 9 mg/dL (ref 8.9–10.3)
Chloride: 99 mmol/L (ref 98–111)
Creatinine, Ser: 0.63 mg/dL (ref 0.44–1.00)
GFR calc Af Amer: >60 mL/min (ref >60)
GFR calc non Af Amer: >60 mL/min (ref >60)
Glucose, Bld: 274 mg/dL — ABNORMAL HIGH (ref 70–99)
POTASSIUM: 4.7 mmol/L (ref 3.5–5.1)
Sodium: 142 mmol/L (ref 135–145)
Total Protein: 5.7 g/dL — ABNORMAL LOW (ref 6.5–8.1)

## 2018-03-13 MED ORDER — NICOTINE 21 MG/24HR TD PT24
21.0000 mg | MEDICATED_PATCH | Freq: Every day | TRANSDERMAL | Status: DC
  Administered 2018-03-13 – 2018-03-15 (×3): 21 mg via TRANSDERMAL
  Filled 2018-03-13 (×4): qty 1

## 2018-03-13 MED ORDER — INSULIN ASPART 100 UNIT/ML ~~LOC~~ SOLN
2.0000 [IU] | Freq: Three times a day (TID) | SUBCUTANEOUS | Status: DC
  Administered 2018-03-14 – 2018-03-15 (×4): 2 [IU] via SUBCUTANEOUS

## 2018-03-13 NOTE — Evaluation (Addendum)
Occupational Therapy Evaluation Patient Details Name: Sheila AltesDeborah J Holmes MRN: [id number] DOB: 08-22-1945 Today's Date: 03/13/2018    History of Present Illness Pt is a 72 y.o. female admitted 03/10/18 with respiratory distress; worked up for COPD exacerbation. PMH includes COPD, emphysema, DM, cataract.   Clinical Impression   PT admitted with see above. Pt currently with functional limitiations due to the deficits listed below (see OT problem list). Pt currently limited by oxygen desaturation on 3L to 80% with functional ADL. Pt with coughing with desaturations with adl task at the end of session. Pt noted to have cognitive deficits and concerns with home alone. Pt will require increase level of care at discharge. Recommending SNF until family support for 24/7 (A) can be determined.  Pt will benefit from skilled OT to increase their independence and safety with adls and balance to allow discharge SNF.  Pt with a broken night guard after eating bacon with guard in her mouth. Pt states "I have had that in my whole time here and no one told me." Pt unable to recall information after 3 minutes.     Follow Up Recommendations  SNF(cognitive deficits- concerns for home alone)    Equipment Recommendations  Other (comment)(RW)    Recommendations for Other Services       Precautions / Restrictions Precautions Precautions: Fall Precaution Comments: Watch SpO2      Mobility Bed Mobility Overal bed mobility: Modified Independent             General bed mobility comments: incr time with HOB 40 degrees.   Transfers Overall transfer level: Needs assistance Equipment used: Rolling walker (2 wheeled) Transfers: Sit to/from Stand Sit to Stand: Min guard         General transfer comment: cues to hand placement. pt fixated initially on placement of lines and attempting to move off RW. pt educated on the line placement by OT    Balance Overall balance assessment: Needs assistance    Sitting balance-Leahy Scale: Good       Standing balance-Leahy Scale: Fair Standing balance comment: use of RW                            ADL either performed or assessed with clinical judgement   ADL Overall ADL's : Needs assistance/impaired Eating/Feeding: Set up   Grooming: Oral care;Set up;Standing Grooming Details (indicate cue type and reason): pt needs help with initial sequence     Lower Body Bathing: Minimal assistance;Sit to/from stand Lower Body Bathing Details (indicate cue type and reason): pt performed peri care due to incontinence and lack of awareness. pt states I told them "i feel moist"  Upper Body Dressing : Minimal assistance Upper Body Dressing Details (indicate cue type and reason): min (A) due to lines and leads     Toilet Transfer: Min guard;RW           Functional mobility during ADLs: Min guard;Rolling walker General ADL Comments: pt demonstrates decr oxygen saturations with mobility adn functional task on 3L . pt lacks awareness to deficits     Vision         Perception     Praxis      Pertinent Vitals/Pain Pain Assessment: No/denies pain     Hand Dominance Right   Extremity/Trunk Assessment Upper Extremity Assessment Upper Extremity Assessment: Generalized weakness       Cervical / Trunk Assessment Cervical / Trunk Assessment: Lordotic   Communication Communication  Communication: No difficulties   Cognition Arousal/Alertness: Awake/alert Behavior During Therapy: WFL for tasks assessed/performed Overall Cognitive Status: Impaired/Different from baseline Area of Impairment: Memory;Awareness                     Memory: Decreased short-term memory     Awareness: Emergent   General Comments: pt with no recall of date or location ( [place]-- one of those two- ive been in both so much)- pt asked to recall the information again at the end of session and with delay and deficits noted. pt reports oh its  the my coffee day-- delayed recall Sunday   General Comments  oxygen saturations decr and monitored during session    Exercises     Shoulder Instructions      Home Living Family/patient expects to be discharged to:: Private residence Living Arrangements: Alone Available Help at Discharge: Family;Friend(s);Available PRN/intermittently Type of Home: House Home Access: Ramped entrance     Home Layout: One level     Bathroom Shower/Tub: Chief Strategy OfficerTub/shower unit   Bathroom Toilet: Standard     Home Equipment: Bedside commode;Shower seat   Additional Comments: wears a night guard for sleeping      Prior Functioning/Environment Level of Independence: Independent        Comments: Does not wear home O2        OT Problem List: Decreased strength;Decreased activity tolerance;Impaired balance (sitting and/or standing);Decreased safety awareness;Decreased knowledge of use of DME or AE;Decreased knowledge of precautions;Cardiopulmonary status limiting activity;Decreased cognition      OT Treatment/Interventions: Self-care/ADL training;Therapeutic exercise;Neuromuscular education;Energy conservation;DME and/or AE instruction;Manual therapy;Modalities;Therapeutic activities;Cognitive remediation/compensation;Patient/family education;Balance training    OT Goals(Current goals can be found in the care plan section) Acute Rehab OT Goals Patient Stated Goal: to get night guard fixed OT Goal Formulation: With patient Time For Goal Achievement: 03/27/18 Potential to Achieve Goals: Good  OT Frequency: Min 2X/week   Barriers to D/C:            Co-evaluation              AM-PAC OT "6 Clicks" Daily Activity     Outcome Measure Help from another person eating meals?: A Little Help from another person taking care of personal grooming?: A Little Help from another person toileting, which includes using toliet, bedpan, or urinal?: A Little Help from another person bathing (including  washing, rinsing, drying)?: A Little Help from another person to put on and taking off regular upper body clothing?: A Little Help from another person to put on and taking off regular lower body clothing?: A Little 6 Click Score: 18   End of Session Equipment Utilized During Treatment: Gait belt;Rolling walker Nurse Communication: Mobility status;Precautions  Activity Tolerance: Patient tolerated treatment well Patient left: in chair;with call bell/phone within reach;with chair alarm set(Tech called to help locate batteries for chair alarm)  OT Visit Diagnosis: Unsteadiness on feet (R26.81);Muscle weakness (generalized) (M62.81)                Time: [id number] OT Time Calculation (min): 39 min Charges:  OT General Charges $OT Visit: 1 Visit OT Evaluation $OT Eval Moderate Complexity: 1 Mod OT Treatments $Self Care/Home Management : 8-22 mins   Mateo FlowBrynn Donya Tomaro, OTR/L  Acute Rehabilitation Services Pager: [phone] Office: [phone] .   Mateo FlowBrynn Leeandre Nordling 03/13/2018, 9:36 AM

## 2018-03-13 NOTE — Progress Notes (Signed)
Patient's son called and expressed concerns about his mother seeing "mist of light" which became more frequent as the day goes by. Patient insisted that she still sees the lights. According to the patient, It started around 1 PM and became more frequent. Pt's VS are stable. We'll continue to monitor

## 2018-03-13 NOTE — Progress Notes (Signed)
PROGRESS NOTE    Sheila AltesDeborah J Rickey  KGM:[id number] DOB: 1946-02-11 DOA: 03/10/2018 PCP: Loleta DickerJudge, Erin, FNP    Brief Narrative:   Sheila Holmes is a 72 y.o. female with medical history significant for COPD, type 2 diabetes mellitus, and erythrocytosis managed with phlebotomy, now presenting to the emergency department with acute respiratory distress.    Assessment & Plan:   Principal Problem:   COPD with acute exacerbation (HCC) Active Problems:   Acute respiratory failure with hypoxia and hypercapnia (HCC)   Erythrocytosis   High transaminase levels   Diabetes mellitus without complication (HCC)   Leukopenia   Acute respiratory failure with hypoxia and hypercapnia probably secondary to acute COPD exacerbation and bilateral bronchopneumonia. Improving, patient did not use any BiPAP overnight, she is currently on 3 L of nasal cannula oxygen. Started tapering IV steroids, continue with duo nebs and Dulera. Continue with IV Rocephin and Zithromax.  Sputum cultures and blood cultures are pending Urine for streptococcal antigen pending Nasal cannula oxygen to keep sats greater than 90%  Nicotine patch for tobacco abuse.  Pt ambulated and required about 3l it of [place] oxygen to keep sats greater than 90%.     Deconditioned and multiple falls in the past due to instability. PT and OT evaluations done , recommending SNF. Social work consult requested    Type 2 diabetes mellitus CBG (last 3)  Recent Labs    03/13/18 0846 03/13/18 1113 03/13/18 1629  GLUCAP 220* 218* 200*   Resume sliding scale insulin.  Add 2 units of NovoLog 3 times daily AC.  Hemoglobin A1c pending   Mild anemia of chronic disease  Hemoglobin stable at 12    Elevated AST and ALT total bilirubin within normal limits Ultrasound abdomen showed gallbladder stones without any evidence of acute cholecystitis. Patient denies any nausea vomiting or abdominal pain Liver enzymes have improved Acute  hepatitis panel negative recommend repeating liver enzymes in 4 weeks  Hyperkalemia Kayexalate ordered.  She is asymptomatic. Repeat K within normal limits   DVT prophylaxis: Lovenox Code Status: Full code Family Communication: Son at bedside Disposition Plan: Pending clinical improvement Consultants:   None  Procedures: None Antimicrobials: IV Rocephin and Zithromax  Subjective: Patient still tachypneic but not in any kind of distress currently on 3 L of nasal cannula oxygen.  She reports breathing is better, denies any chest pain.  Objective: Vitals:   03/13/18 0900 03/13/18 1100 03/13/18 1258 03/13/18 1300  BP:   120/78 120/78  Pulse: (!) 103 84 96 88  Resp: 15 (!) 27 20 (!) 21  Temp:   98.3 F (36.8 C)   TempSrc:   Oral   SpO2: 91% 97% 98% 94%  Weight:      Height:        Intake/Output Summary (Last 24 hours) at 03/13/2018 1753 Last data filed at 03/13/2018 1735 Gross per 24 hour  Intake 957 ml  Output 350 ml  Net 607 ml   Filed Weights   03/10/18 1528 03/10/18 2256 03/13/18 0200  Weight: 61.2 kg 60.1 kg 61.2 kg    Examination:  General exam: Sitting in chair, on 3 L of nasal cannula oxygen Respiratory system: Tachypneic while talking, no wheezing heard on exam today Cardiovascular system: S1 & S2 heard, RRR. No JVD, murmurs,  Gastrointestinal system: Abdomen is soft NT nd, bs+ Central nervous system: Alert and oriented. No focal neurological deficits. Extremities: Symmetric 5 x 5 power. Skin: No rashes, lesions or ulcers Psychiatry: Mood &  affect appropriate.     Data Reviewed: I have personally reviewed following labs and imaging studies  CBC: Recent Labs  Lab 03/10/18 1500 03/10/18 2351 03/12/18 0253  WBC 3.9* 4.6 8.2  NEUTROABS 3.0 3.7  --   HGB 12.5 11.9* 12.0  HCT 44.4 42.2 42.1  MCV 95.9 92.7 94.6  PLT 160 213 204   Basic Metabolic Panel: Recent Labs  Lab 03/10/18 1643 03/10/18 2351 03/12/18 0253 03/13/18 1342  NA 141 143  142 142  K 5.4* 5.1 5.2* 4.7  CL 105 102 101 99  CO2 26 31 34* 37*  GLUCOSE 162* 154* 172* 274*  BUN 22 22 22 22   CREATININE 0.68 0.67 0.49 0.63  CALCIUM 9.5 9.3 8.9 9.0   GFR: Estimated Creatinine Clearance: 56.3 mL/min (by C-G formula based on SCr of 0.63 mg/dL). Liver Function Tests: Recent Labs  Lab 03/10/18 1643 03/10/18 2351 03/12/18 0253 03/13/18 1342  AST 1,928* 2,893* 973* 220*  ALT 695* 920* 723* 429*  ALKPHOS 71 69 63 58  BILITOT 0.6 0.7 0.3 0.8  PROT 5.9* 6.1* 6.0* 5.7*  ALBUMIN 3.0* 3.0* 2.8* 2.9*   No results for input(s): LIPASE, AMYLASE in the last 168 hours. Recent Labs  Lab 03/10/18 2350  AMMONIA 58*   Coagulation Profile: Recent Labs  Lab 03/10/18 2350  INR 1.34   Cardiac Enzymes: No results for input(s): CKTOTAL, CKMB, CKMBINDEX, TROPONINI in the last 168 hours. BNP (last 3 results) No results for input(s): PROBNP in the last 8760 hours. HbA1C: No results for input(s): HGBA1C in the last 72 hours. CBG: Recent Labs  Lab 03/12/18 2105 03/13/18 0632 03/13/18 0846 03/13/18 1113 03/13/18 1629  GLUCAP 206* 172* 220* 218* 200*   Lipid Profile: No results for input(s): CHOL, HDL, LDLCALC, TRIG, CHOLHDL, LDLDIRECT in the last 72 hours. Thyroid Function Tests: No results for input(s): TSH, T4TOTAL, FREET4, T3FREE, THYROIDAB in the last 72 hours. Anemia Panel: No results for input(s): VITAMINB12, FOLATE, FERRITIN, TIBC, IRON, RETICCTPCT in the last 72 hours. Sepsis Labs: Recent Labs  Lab 03/10/18 2350  PROCALCITON 0.10    No results found for this or any previous visit (from the past 240 hour(s)).       Radiology Studies: No results found.      Scheduled Meds: . aspirin EC  81 mg Oral Daily  . azithromycin  500 mg Oral QHS  . enoxaparin (LOVENOX) injection  40 mg Subcutaneous Daily  . feeding supplement (GLUCERNA SHAKE)  237 mL Oral BID BM  . insulin aspart  0-9 Units Subcutaneous TID AC & HS  . ipratropium-albuterol  3 mL  Nebulization BID  . methylPREDNISolone (SOLU-MEDROL) injection  60 mg Intravenous Q12H  . minocycline  50 mg Oral Daily  . mometasone-formoterol  2 puff Inhalation BID  . nicotine  21 mg Transdermal Daily  . sodium chloride flush  3 mL Intravenous Q12H  . sodium chloride flush  3 mL Intravenous Q12H   Continuous Infusions: . sodium chloride Stopped (03/12/18 1344)  . cefTRIAXone (ROCEPHIN)  IV 1 g (03/13/18 1419)     LOS: 3 days    Time spent: 30 minutes    Kathlen ModyVijaya Jaleeya Mcnelly, MD [facility] Pager [phone]  If 7PM-7AM, please contact night-coverage [email or web address] Password [email or web address] 03/13/2018, 5:53 PM

## 2018-03-13 NOTE — Plan of Care (Signed)
Care plans reviewed and patient is progressing.  

## 2018-03-14 LAB — GLUCOSE, CAPILLARY
Glucose-Capillary: 192 mg/dL — ABNORMAL HIGH (ref 70–99)
Glucose-Capillary: 183 mg/dL — ABNORMAL HIGH (ref 70–99)
Glucose-Capillary: 175 mg/dL — ABNORMAL HIGH (ref 70–99)
Glucose-Capillary: 122 mg/dL — ABNORMAL HIGH (ref 70–99)

## 2018-03-14 LAB — HEMOGLOBIN A1C
Hgb A1c MFr Bld: 6.2 % — ABNORMAL HIGH (ref 4.8–5.6)
Mean Plasma Glucose: 131.24 mg/dL

## 2018-03-14 MED ORDER — PREDNISONE 20 MG PO TABS
60.0000 mg | ORAL_TABLET | Freq: Every day | ORAL | Status: DC
  Administered 2018-03-15: 60 mg via ORAL
  Filled 2018-03-14: qty 3

## 2018-03-14 MED ORDER — METHYLPREDNISOLONE SODIUM SUCC 125 MG IJ SOLR: 60.0000 mg | Freq: Every day | INTRAMUSCULAR | Status: DC

## 2018-03-14 NOTE — Clinical Social Work Note (Signed)
Clinical Social Work Assessment  Patient Details  Name: Sheila Holmes MRN: [id number] Date of Birth: [birth year]  Date of referral:  03/14/18               Reason for consult:  Discharge Planning                Permission sought to share information with:  Case Manager Permission granted to share information::  Yes, Verbal Permission Granted  Name::     [place]::  [employer] [facility]   Relationship::  Son  Sport and exercise psychologist Information:     Housing/Transportation Living arrangements for the past 2 months:  Single Family Home Source of Information:  Patient, Adult Children Patient Interpreter Needed:  None Criminal Activity/Legal Involvement Pertinent to Current Situation/Hospitalization:  No - Comment as needed Significant Relationships:  Adult Children Lives with:  Self Do you feel safe going back to the place where you live?  Yes Need for family participation in patient care:  No (Coment)  Care giving concerns:    Patient was alert and oriented. CSW spoke with the patient and her son. Her son thinks that she could benefit from having some additional resources that a skilled nursing facility could provide. Son would not like his mother to return home without receiving some additional resources. Patient did not have any specific concerns.    Social Worker assessment / plan:    CSW met with the patient and son at bedside. CSW was speaking with the patient before her son walked into the room. CSW provided a SNF list to the patient and her son. Patient's son had already decided on two facilities. CSW completed the Fl2 and faxed out to [facility] and [facility]. CSW is awaiting a bed offer.   CSW also spoke with the nurse to put in a consult for chaplain services. Family would like to explore making the son his mother's medical power of attorney.   Employment status:  Retired Nurse, adult PT Recommendations:  Long / Referral to community resources:     Patient/Family's Response to care:  Both patient and son are aware of what is going on with the patient.   Patient/Family's Understanding of and Emotional Response to Diagnosis, Current Treatment, and Prognosis:  Son is understanding and advocating the needs for his mother.   Emotional Assessment Appearance:  Appears stated age Attitude/Demeanor/Rapport:  Engaged Affect (typically observed):  Appropriate Orientation:  Oriented to Self, Oriented to  Time, Oriented to Situation, Oriented to Place Alcohol / Substance use:  Not Applicable Psych involvement (Current and /or in the community):     Discharge Needs  Concerns to be addressed:  Discharge Planning Concerns Readmission within the last 30 days:  No Current discharge risk:  Lives alone Barriers to Discharge:  Ship broker, Continued Medical Work up   [employer], Bear 03/14/2018, 12:57 PM

## 2018-03-14 NOTE — Progress Notes (Signed)
Rehab Admissions Coordinator Note:  AC was contacted by pt's son, Sheila Holmes, with a request for CIR to screen his mother. Patient was screened by Sheila Holmes for appropriateness for an Inpatient Acute Rehab Consult.  At this time, we are recommending Skilled Nursing Facility. AC explained the differences between CIR and SNF and did explain current recommendations for SNF. Pt's son thankful for explanation. AC contacted CM Kristi to inform her of conversation; per CM, SW aware of needs and will be speaking with the pt's son.    Sheila Holmes 03/14/2018, 10:36 AM  I can be reached at [phone].

## 2018-03-14 NOTE — NC FL2 (Signed)
[place] MEDICAID FL2 LEVEL OF CARE SCREENING TOOL     IDENTIFICATION  Patient Name: Sheila AltesDeborah J Portell Birthdate: September 13, 1945 Sex: female Admission Date (Current Location): 03/10/2018  [facility] [place]   Facility and Address:  [facility], [address], [place], [place] [address]      Provider Number: [id number]  Attending Physician Name and Address:  Kathlen ModyAkula, Vijaya, MD  Relative Name and Phone Number:  Jule EconomyHarold Antkowiak, Spouse, [phone]    Current Level of Care: Hospital Recommended Level of Care: Skilled Nursing Facility Prior Approval Number:    Date Approved/Denied: 03/14/18 PASRR Number: [id number]  Discharge Plan:      Current Diagnoses: Patient Active Problem List   Diagnosis Date Noted  . COPD with acute exacerbation (HCC) 03/10/2018  . Acute respiratory failure with hypoxia and hypercapnia (HCC) 03/10/2018  . Erythrocytosis 03/10/2018  . High transaminase levels 03/10/2018  . Diabetes mellitus without complication (HCC) 03/10/2018  . Leukopenia 03/10/2018  . Right leg swelling     Orientation RESPIRATION BLADDER Height & Weight     Place, Self  O2(Sp02 96, nasal cannula, 3 (FR)) Incontinent(Has external catheter, dedicated suction) Weight: 135 lb (61.2 kg) Height:  5\' 2"  (157.5 cm)  BEHAVIORAL SYMPTOMS/MOOD NEUROLOGICAL BOWEL NUTRITION STATUS      Continent Diet(Cardiac/Heart Healthy, thin liquids)  AMBULATORY STATUS COMMUNICATION OF NEEDS Skin   Limited Assist Verbally Normal                       Personal Care Assistance Level of Assistance  Bathing, Feeding, Dressing, Total care Bathing Assistance: Limited assistance Feeding assistance: Limited assistance Dressing Assistance: Limited assistance Total Care Assistance: Limited assistance   Functional Limitations Info  Sight, Hearing, Speech Sight Info: Adequate Hearing Info: Adequate Speech Info: Adequate    SPECIAL CARE FACTORS  FREQUENCY  PT (By licensed PT), OT (By licensed OT)     PT Frequency: 5x/wk OT Frequency: 5x/wk            Contractures Contractures Info: Not present    Additional Factors Info  Allergies, Code Status Code Status Info: Full Code Allergies Info: Penicillins           Current Medications (03/14/2018):  This is the current hospital active medication list Current Facility-Administered Medications  Medication Dose Route Frequency Provider Last Rate Last Dose  . 0.9 %  sodium chloride infusion  250 mL Intravenous PRN Opyd, Lavone Neriimothy S, MD   Stopped at 03/12/18 1344  . albuterol (PROVENTIL) (2.5 MG/3ML) 0.083% nebulizer solution 2.5 mg  2.5 mg Nebulization Q4H PRN Opyd, Lavone Neriimothy S, MD      . aspirin EC tablet 81 mg  81 mg Oral Daily Opyd, Lavone Neriimothy S, MD   81 mg at 03/14/18 0852  . azithromycin (ZITHROMAX) tablet 500 mg  500 mg Oral QHS Kathlen ModyAkula, Vijaya, MD   500 mg at 03/13/18 2024  . cefTRIAXone (ROCEPHIN) 1 g in sodium chloride 0.9 % 100 mL IVPB  1 g Intravenous Q24H Kathlen ModyAkula, Vijaya, MD 200 mL/hr at 03/13/18 1419 1 g at 03/13/18 1419  . enoxaparin (LOVENOX) injection 40 mg  40 mg Subcutaneous Daily Opyd, Lavone Neriimothy S, MD   40 mg at 03/14/18 0852  . feeding supplement (GLUCERNA SHAKE) (GLUCERNA SHAKE) liquid 237 mL  237 mL Oral BID BM Kathlen ModyAkula, Vijaya, MD   237 mL at 03/14/18 0901  . insulin aspart (novoLOG) injection 0-9 Units  0-9 Units Subcutaneous TID AC &  HS Kathlen ModyAkula, Vijaya, MD   2 Units at 03/14/18 [phone]  . insulin aspart (novoLOG) injection 2 Units  2 Units Subcutaneous TID WC Kathlen ModyAkula, Vijaya, MD   2 Units at 03/14/18 [phone]  . ipratropium-albuterol (DUONEB) 0.5-2.5 (3) MG/3ML nebulizer solution 3 mL  3 mL Nebulization BID Kathlen ModyAkula, Vijaya, MD   3 mL at 03/14/18 0843  . [START ON 03/15/2018] methylPREDNISolone sodium succinate (SOLU-MEDROL) 125 mg/2 mL injection 60 mg  60 mg Intravenous Daily Kathlen ModyAkula, Vijaya, MD      . minocycline (MINOCIN,DYNACIN) capsule 50 mg  50 mg Oral Daily Opyd, Lavone Neriimothy S, MD   50  mg at 03/14/18 0852  . mometasone-formoterol (DULERA) 100-5 MCG/ACT inhaler 2 puff  2 puff Inhalation BID Opyd, Lavone Neriimothy S, MD   2 puff at 03/14/18 0843  . nicotine (NICODERM CQ - dosed in mg/24 hours) patch 21 mg  21 mg Transdermal Daily Kathlen ModyAkula, Vijaya, MD   21 mg at 03/13/18 1417  . ondansetron (ZOFRAN) tablet 4 mg  4 mg Oral Q6H PRN Opyd, Lavone Neriimothy S, MD       Or  . ondansetron (ZOFRAN) injection 4 mg  4 mg Intravenous Q6H PRN Opyd, Lavone Neriimothy S, MD      . senna-docusate (Senokot-S) tablet 1 tablet  1 tablet Oral QHS PRN Opyd, Lavone Neriimothy S, MD      . sodium chloride flush (NS) 0.9 % injection 3 mL  3 mL Intravenous Q12H Opyd, Lavone Neriimothy S, MD   3 mL at 03/14/18 0855  . sodium chloride flush (NS) 0.9 % injection 3 mL  3 mL Intravenous Q12H Opyd, Lavone Neriimothy S, MD   3 mL at 03/14/18 0855  . sodium chloride flush (NS) 0.9 % injection 3 mL  3 mL Intravenous PRN Opyd, Lavone Neriimothy S, MD         Discharge Medications: Please see discharge summary for a list of discharge medications.  Relevant Imaging Results:  Relevant Lab Results:   Additional Information SSN: [id number]  Nada BoozerCaitlin B Mushka Laconte, LCSWA

## 2018-03-14 NOTE — Progress Notes (Signed)
   03/14/18 1400  Clinical Encounter Type  Visited With Patient  Visit Type Initial  Referral From Nurse  Consult/Referral To Chaplain  Spiritual Encounters  Spiritual Needs Emotional;Prayer  Stress Factors  Patient Stress Factors None identified   Responded to spiritual care consult. PT stated that she already has healthcare power of attorney. PT was thankful for the Chaplain visit. I offered spiritual care with words of encouragement, a listening ear, and prayer. Chaplain available as needed.  Chaplain Orest DikesAbel Cristianna Cyr [phone]

## 2018-03-14 NOTE — Care Management Important Message (Signed)
Important Message  Patient Details  Name: Aliene AltesDeborah J Crothers MRN: [id number] Date of Birth: 1945/08/12   Medicare Important Message Given:  Yes    Kalle Bernath P Quinnton Bury 03/14/2018, 3:08 PM

## 2018-03-14 NOTE — Progress Notes (Signed)
03/14/18:  [facility] is able to take the patient. They are starting insurance authorization.   Drucilla Schmidtaitlin Alexy Heldt, MSW, LCSW-A Clinical Social Worker [facility]

## 2018-03-14 NOTE — Progress Notes (Signed)
PROGRESS NOTE    Sheila AltesDeborah J Keinath  ZOX:[id number] DOB: Jul 29, 1945 DOA: 03/10/2018 PCP: Loleta DickerJudge, Erin, FNP    Brief Narrative:   Sheila Holmes is a 72 y.o. female with medical history significant for COPD, type 2 diabetes mellitus, and erythrocytosis managed with phlebotomy, now presenting to the emergency department with acute respiratory distress.    Assessment & Plan:   Principal Problem:   COPD with acute exacerbation (HCC) Active Problems:   Acute respiratory failure with hypoxia and hypercapnia (HCC)   Erythrocytosis   High transaminase levels   Diabetes mellitus without complication (HCC)   Leukopenia   Acute respiratory failure with hypoxia and hypercapnia probably secondary to acute COPD exacerbation and bilateral bronchopneumonia. Improving, patient did not use any BiPAP overnight, she is currently on 3 L of nasal cannula oxygen. Started tapering IV steroids, continue with duo nebs and Dulera. Transition to oral steroids tomorrow as her wheezing has resolved. She continues to cough.  Continue with IV Rocephin and Zithromax.  Sputum cultures and blood cultures are pending Nasal cannula oxygen to keep sats greater than 90%  Nicotine patch for tobacco abuse.  Pt ambulated and required about 3l it of [place] oxygen to keep sats greater than 90%.     Deconditioned and multiple falls in the past due to instability. PT and OT evaluations done , recommending SNF. Social work consult requested    Type 2 diabetes mellitus  Better controlled today CBG (last 3)  Recent Labs    03/13/18 2115 03/14/18 0621 03/14/18 1122  GLUCAP 162* 192* 183*   Resume sliding scale insulin.  Added 2 units of NovoLog 3 times daily AC.  Hemoglobin A1c 6.2   Mild anemia of chronic disease  Hemoglobin stable at 12    Elevated AST and ALT total bilirubin within normal limits Ultrasound abdomen showed gallbladder stones without any evidence of acute cholecystitis. Patient denies any  nausea vomiting or abdominal pain Liver enzymes have improved Acute hepatitis panel negative recommend repeating liver enzymes in 4 weeks  Hyperkalemia Kayexalate ordered.  She is asymptomatic. Repeat K within normal limits   DVT prophylaxis: Lovenox Code Status: Full code Family Communication: none at bedside Disposition Plan: Pending clinical improvement Consultants:   None  Procedures: None Antimicrobials: IV Rocephin and Zithromax  Subjective: Patient still tachypneic but not in any kind of distress currently on 3 L of nasal cannula oxygen.  Objective: Vitals:   03/14/18 0843 03/14/18 0845 03/14/18 1402 03/14/18 1604  BP:   (!) 146/84 139/86  Pulse:   99 80  Resp:   18 17  Temp:   98.4 F (36.9 C) 98.7 F (37.1 C)  TempSrc:   Oral Oral  SpO2: 96% 96% 94% 94%  Weight:      Height:        Intake/Output Summary (Last 24 hours) at 03/14/2018 1633 Last data filed at 03/14/2018 1311 Gross per 24 hour  Intake 600 ml  Output 1 ml  Net 599 ml   Filed Weights   03/10/18 1528 03/10/18 2256 03/13/18 0200  Weight: 61.2 kg 60.1 kg 61.2 kg    Examination:  General exam: Sitting in chair, on 3 L of nasal cannula oxygen Respiratory system: Tachypneic while talking, no wheezing heard on exam today. Cardiovascular system: S1 & S2 heard, RRR. No JVD, murmurs,  Gastrointestinal system: Abdomen is soft NT nd, bs+ Central nervous system: Alert and oriented. No focal neurological deficits. Extremities: Symmetric 5 x 5 power. Skin: No rashes, lesions  or ulcers Psychiatry: Mood & affect appropriate.     Data Reviewed: I have personally reviewed following labs and imaging studies  CBC: Recent Labs  Lab 03/10/18 1500 03/10/18 2351 03/12/18 0253  WBC 3.9* 4.6 8.2  NEUTROABS 3.0 3.7  --   HGB 12.5 11.9* 12.0  HCT 44.4 42.2 42.1  MCV 95.9 92.7 94.6  PLT 160 213 204   Basic Metabolic Panel: Recent Labs  Lab 03/10/18 1643 03/10/18 2351 03/12/18 0253 03/13/18 1342   NA 141 143 142 142  K 5.4* 5.1 5.2* 4.7  CL 105 102 101 99  CO2 26 31 34* 37*  GLUCOSE 162* 154* 172* 274*  BUN 22 22 22 22   CREATININE 0.68 0.67 0.49 0.63  CALCIUM 9.5 9.3 8.9 9.0   GFR: Estimated Creatinine Clearance: 56.3 mL/min (by C-G formula based on SCr of 0.63 mg/dL). Liver Function Tests: Recent Labs  Lab 03/10/18 1643 03/10/18 2351 03/12/18 0253 03/13/18 1342  AST 1,928* 2,893* 973* 220*  ALT 695* 920* 723* 429*  ALKPHOS 71 69 63 58  BILITOT 0.6 0.7 0.3 0.8  PROT 5.9* 6.1* 6.0* 5.7*  ALBUMIN 3.0* 3.0* 2.8* 2.9*   No results for input(s): LIPASE, AMYLASE in the last 168 hours. Recent Labs  Lab 03/10/18 2350  AMMONIA 58*   Coagulation Profile: Recent Labs  Lab 03/10/18 2350  INR 1.34   Cardiac Enzymes: No results for input(s): CKTOTAL, CKMB, CKMBINDEX, TROPONINI in the last 168 hours. BNP (last 3 results) No results for input(s): PROBNP in the last 8760 hours. HbA1C: Recent Labs    03/13/18 0251  HGBA1C 6.2*   CBG: Recent Labs  Lab 03/13/18 1113 03/13/18 1629 03/13/18 2115 03/14/18 0621 03/14/18 1122  GLUCAP 218* 200* 162* 192* 183*   Lipid Profile: No results for input(s): CHOL, HDL, LDLCALC, TRIG, CHOLHDL, LDLDIRECT in the last 72 hours. Thyroid Function Tests: No results for input(s): TSH, T4TOTAL, FREET4, T3FREE, THYROIDAB in the last 72 hours. Anemia Panel: No results for input(s): VITAMINB12, FOLATE, FERRITIN, TIBC, IRON, RETICCTPCT in the last 72 hours. Sepsis Labs: Recent Labs  Lab 03/10/18 2350  PROCALCITON 0.10    No results found for this or any previous visit (from the past 240 hour(s)).       Radiology Studies: No results found.      Scheduled Meds: . aspirin EC  81 mg Oral Daily  . azithromycin  500 mg Oral QHS  . enoxaparin (LOVENOX) injection  40 mg Subcutaneous Daily  . feeding supplement (GLUCERNA SHAKE)  237 mL Oral BID BM  . insulin aspart  0-9 Units Subcutaneous TID AC & HS  . insulin aspart  2  Units Subcutaneous TID WC  . ipratropium-albuterol  3 mL Nebulization BID  . [START ON 03/15/2018] methylPREDNISolone (SOLU-MEDROL) injection  60 mg Intravenous Daily  . minocycline  50 mg Oral Daily  . mometasone-formoterol  2 puff Inhalation BID  . nicotine  21 mg Transdermal Daily  . sodium chloride flush  3 mL Intravenous Q12H  . sodium chloride flush  3 mL Intravenous Q12H   Continuous Infusions: . sodium chloride Stopped (03/12/18 1344)  . cefTRIAXone (ROCEPHIN)  IV 1 g (03/14/18 1354)     LOS: 4 days    Time spent: 30 minutes    Kathlen ModyVijaya Angeli Demilio, MD [facility] Pager [phone]  If 7PM-7AM, please contact night-coverage [email or web address] Password [email or web address] 03/14/2018, 4:33 PM

## 2018-03-14 NOTE — Progress Notes (Signed)
Physical Therapy Treatment Patient Details Name: Sheila Holmes MRN: [id number] DOB: 07/07/1945 Today's Date: 03/14/2018    History of Present Illness Pt is a 72 y.o. female admitted 03/10/18 with respiratory distress; worked up for COPD exacerbation. PMH includes COPD, emphysema, DM, cataract.    PT Comments    Continuing work on functional mobility and activity tolerance;  Reviewed OT note, and discussed pt status with RN; I'm concerned for her going home alone, given noted short-term memory and cognitive deficits; Her most optimal long-term living situation is an Assisted Living, but that takes weeks to pull together -- I still agree with OT: perhaps a SNF stay for rehab (including cognitive rehab) will be able to be a bridge to an Assisted Living situation.   Follow Up Recommendations  SNF;Supervision/Assistance - 24 hour     Equipment Recommendations  Rolling walker with 5" wheels    Recommendations for Other Services       Precautions / Restrictions Precautions Precautions: Fall Precaution Comments: Watch SpO2    Mobility  Bed Mobility                  Transfers Overall transfer level: Needs assistance Equipment used: Rolling walker (2 wheeled) Transfers: Sit to/from Stand Sit to Stand: Min guard         General transfer comment: Cues for hand placement  Ambulation/Gait Ambulation/Gait assistance: Supervision Gait Distance (Feet): 350 Feet Assistive device: Rolling walker (2 wheeled) Gait Pattern/deviations: Step-through pattern;Decreased stride length Gait velocity: Decreased   General Gait Details: Slow, steady amb with RW and supervision for safety. SpO2 down to 78% on RA; started supplemental O2 3 L and O2 sats read 88-90%, incr to 4 L with activity, and sats stayed close to 92%   Stairs             Wheelchair Mobility    Modified Rankin (Stroke Patients Only)       Balance             Standing balance-Leahy Scale:  Fair Standing balance comment: use of RW                             Cognition Arousal/Alertness: Awake/alert Behavior During Therapy: WFL for tasks assessed/performed Overall Cognitive Status: Impaired/Different from baseline Area of Impairment: Memory;Awareness                     Memory: Decreased short-term memory     Awareness: Emergent          Exercises      General Comments General comments (skin integrity, edema, etc.): oxygen saturations decr and monitored during session      Pertinent Vitals/Pain Pain Assessment: No/denies pain    Home Living                      Prior Function            PT Goals (current goals can now be found in the care plan section) Acute Rehab PT Goals Patient Stated Goal: Get plan in place for when she leaves the hospital PT Goal Formulation: With patient Time For Goal Achievement: 03/26/18 Potential to Achieve Goals: Good Progress towards PT goals: Progressing toward goals    Frequency    Min 2X/week (updated per dept protocol)      PT Plan Discharge plan needs to be updated  Frequency needs to be updated per dept  protocol    Co-evaluation              AM-PAC PT "6 Clicks" Mobility   Outcome Measure  Help needed turning from your back to your side while in a flat bed without using bedrails?: None Help needed moving from lying on your back to sitting on the side of a flat bed without using bedrails?: None Help needed moving to and from a bed to a chair (including a wheelchair)?: None Help needed standing up from a chair using your arms (e.g., wheelchair or bedside chair)?: A Little Help needed to walk in hospital room?: A Little Help needed climbing 3-5 steps with a railing? : A Little 6 Click Score: 21    End of Session Equipment Utilized During Treatment: Gait belt Activity Tolerance: Patient tolerated treatment well Patient left: in chair;with call bell/phone within reach;with  chair alarm set Nurse Communication: Mobility status;Other (comment)(Most recent DC plan recs) PT Visit Diagnosis: Other abnormalities of gait and mobility (R26.89);Repeated falls (R29.6)     Time: [id number] PT Time Calculation (min) (ACUTE ONLY): 26 min  Charges:  $Gait Training: 23-37 mins                     Van ClinesHolly Babbette Holmes, [place]PT  Acute Rehabilitation Services Pager [phone] Office [phone]    Sheila Holmes 03/14/2018, 10:11 AM

## 2018-03-14 NOTE — Progress Notes (Signed)
Physical Therapy Note  SATURATION QUALIFICATIONS: (This note is used to comply with regulatory documentation for home oxygen)  Patient Saturations on Room Air at Rest = 82%  Patient Saturations on Room Air while Ambulating = 79%  Patient Saturations on 3-4 Liters of oxygen while Ambulating = 92%  Please briefly explain why patient needs home oxygen:  Patient requires supplemental oxygen to maintain oxygen saturations at acceptable, safe levels with physical activity.  See also full treatment note to follow,   Van ClinesHolly Kanisha Duba, PT  Acute Rehabilitation Services Pager [phone] Office [phone]

## 2018-03-15 DIAGNOSIS — R278 Other lack of coordination: Secondary | ICD-10-CM | POA: Diagnosis not present

## 2018-03-15 DIAGNOSIS — M6281 Muscle weakness (generalized): Secondary | ICD-10-CM | POA: Diagnosis not present

## 2018-03-15 DIAGNOSIS — D649 Anemia, unspecified: Secondary | ICD-10-CM | POA: Diagnosis not present

## 2018-03-15 DIAGNOSIS — Z7401 Bed confinement status: Secondary | ICD-10-CM | POA: Diagnosis not present

## 2018-03-15 DIAGNOSIS — R0602 Shortness of breath: Secondary | ICD-10-CM | POA: Diagnosis not present

## 2018-03-15 DIAGNOSIS — R740 Nonspecific elevation of levels of transaminase and lactic acid dehydrogenase [LDH]: Secondary | ICD-10-CM | POA: Diagnosis not present

## 2018-03-15 DIAGNOSIS — D751 Secondary polycythemia: Secondary | ICD-10-CM | POA: Diagnosis not present

## 2018-03-15 DIAGNOSIS — J9601 Acute respiratory failure with hypoxia: Secondary | ICD-10-CM | POA: Diagnosis not present

## 2018-03-15 DIAGNOSIS — M2550 Pain in unspecified joint: Secondary | ICD-10-CM | POA: Diagnosis not present

## 2018-03-15 DIAGNOSIS — R2689 Other abnormalities of gait and mobility: Secondary | ICD-10-CM | POA: Diagnosis not present

## 2018-03-15 DIAGNOSIS — J9602 Acute respiratory failure with hypercapnia: Secondary | ICD-10-CM | POA: Diagnosis not present

## 2018-03-15 DIAGNOSIS — E785 Hyperlipidemia, unspecified: Secondary | ICD-10-CM | POA: Diagnosis not present

## 2018-03-15 DIAGNOSIS — J449 Chronic obstructive pulmonary disease, unspecified: Secondary | ICD-10-CM | POA: Diagnosis not present

## 2018-03-15 DIAGNOSIS — D638 Anemia in other chronic diseases classified elsewhere: Secondary | ICD-10-CM | POA: Diagnosis not present

## 2018-03-15 DIAGNOSIS — J189 Pneumonia, unspecified organism: Secondary | ICD-10-CM | POA: Diagnosis not present

## 2018-03-15 DIAGNOSIS — J441 Chronic obstructive pulmonary disease with (acute) exacerbation: Secondary | ICD-10-CM | POA: Diagnosis not present

## 2018-03-15 DIAGNOSIS — E119 Type 2 diabetes mellitus without complications: Secondary | ICD-10-CM | POA: Diagnosis not present

## 2018-03-15 LAB — GLUCOSE, CAPILLARY
Glucose-Capillary: 161 mg/dL — ABNORMAL HIGH (ref 70–99)
Glucose-Capillary: 105 mg/dL — ABNORMAL HIGH (ref 70–99)

## 2018-03-15 MED ORDER — INSULIN ASPART 100 UNIT/ML ~~LOC~~ SOLN: SUBCUTANEOUS | 11 refills | Status: AC

## 2018-03-15 MED ORDER — LEVOFLOXACIN 750 MG PO TABS: 750.0000 mg | ORAL_TABLET | Freq: Every day | ORAL | 0 refills | Status: AC

## 2018-03-15 MED ORDER — NICOTINE 21 MG/24HR TD PT24: 21.0000 mg | MEDICATED_PATCH | Freq: Every day | TRANSDERMAL | 0 refills | Status: AC

## 2018-03-15 MED ORDER — GLUCERNA SHAKE PO LIQD: 237.0000 mL | Freq: Two times a day (BID) | ORAL | 0 refills | Status: AC

## 2018-03-15 MED ORDER — PREDNISONE 20 MG PO TABS: ORAL_TABLET | ORAL | 0 refills | Status: AC

## 2018-03-15 MED ORDER — SENNOSIDES-DOCUSATE SODIUM 8.6-50 MG PO TABS: 1.0000 | ORAL_TABLET | Freq: Every evening | ORAL | Status: AC | PRN

## 2018-03-15 NOTE — Progress Notes (Signed)
Patient in a stable condition, discharge education reviewed with patient and her son at bedside, they verbalized understanding, iv removed, patient belongings at bedside,  [facility], [facility]

## 2018-03-15 NOTE — Progress Notes (Signed)
Clinical Social Worker facilitated patient discharge including contacting patient family and facility to confirm patient discharge plans.  Clinical information faxed to facility and family agreeable with plan.  CS[facility]  RN to call [phone] (rm# [phone]) for report prior to discharge.  Clinical Social Worker will sign off for now as social work intervention is no longer needed. Please consult us again if new need arises.  Marrianne MoodAshley Roopa Graver, MSW, [employer] IncLCSWA [phone]

## 2018-03-15 NOTE — Discharge Summary (Signed)
Physician Discharge Summary  Aliene AltesDeborah J Sova FAO:[id number] DOB: 21-Dec-1945 DOA: 03/10/2018  PCP: Loleta DickerJudge, Erin, FNP  Admit date: 03/10/2018 Discharge date: 03/15/2018  Admitted From: Home Disposition:  SNF  Recommendations for Outpatient Follow-up:  1. Follow up with PCP in 1-2 weeks 2. Please obtain BMP/CBC in one week Please follow up with pulmonology in 1 to 2 weeks.   Discharge Condition:stable.  CODE STATUS: full code.  Diet recommendation: Heart Healthy  Brief/Interim Summary: Ezzard StandingDeborah J Froelichis a 72 y.o.femalewith medical history significant forCOPD, type 2 diabetes mellitus, and erythrocytosis managed with phlebotomy, now presenting to the emergency department with acute respiratory distress  Discharge Diagnoses:  Principal Problem:   COPD with acute exacerbation (HCC) Active Problems:   Acute respiratory failure with hypoxia and hypercapnia (HCC)   Erythrocytosis   High transaminase levels   Diabetes mellitus without complication (HCC)   Leukopenia  Acute respiratory failure with hypoxia and hypercapnia probably secondary to acute COPD exacerbation and bilateral bronchopneumonia. Improving, patient did not use any BiPAP overnight, she is currently on 3 L of nasal cannula oxygen. Started tapering IV steroids, continue with duo nebs and Dulera. Transition to quick steroid taper in the next one week. She continues to cough.  Transition IV Antibiotics to oral levaquin to complete the course.  Nasal cannula oxygen to keep sats greater than 90%  Nicotine patch for tobacco abuse.  Pt ambulated and required about 3l it of [place] oxygen to keep sats greater than 90%.     Deconditioned and multiple falls in the past due to instability. PT and OT evaluations done , recommending SNF. Social work consult requested    Type 2 diabetes mellitus  Better controlled today CBG (last 3)  Recent Labs    03/14/18 2147 03/15/18 0622 03/15/18 1054  GLUCAP 122* 161*  105*    Resume sliding scale insulin while on prednisone.   Hemoglobin A1c 6.2   Mild anemia of chronic disease  Hemoglobin stable at 12    Elevated AST and ALT total bilirubin within normal limits Ultrasound abdomen showed gallbladder stones without any evidence of acute cholecystitis. Patient denies any nausea vomiting or abdominal pain Liver enzymes have improved Acute hepatitis panel negative recommend repeating liver enzymes in 4 weeks  Hyperkalemia Kayexalate ordered. Repeat K within normal limits     Discharge Instructions  Discharge Instructions    Diet - low sodium heart healthy   Complete by:  As directed    Discharge instructions   Complete by:  As directed    Please follow up with pulmonologist in 1 to 2 weeks.     Allergies as of 03/15/2018      Reactions   Penicillins    Causes yeast infection      Medication List    TAKE these medications   albuterol (2.5 MG/3ML) 0.083% nebulizer solution Commonly known as:  PROVENTIL Take 2.5 mg by nebulization every 6 (six) hours as needed for wheezing or shortness of breath.   alendronate 70 MG tablet Commonly known as:  FOSAMAX Take 70 mg by mouth once a week.   aspirin EC 81 MG tablet Take 81 mg by mouth daily.   budesonide-formoterol 80-4.5 MCG/ACT inhaler Commonly known as:  SYMBICORT Inhale 2 puffs into the lungs 2 (two) times daily.   CALCIUM + D3 PO Take 1,200 mg by mouth daily.   feeding supplement (GLUCERNA SHAKE) Liqd Take 237 mLs by mouth 2 (two) times daily between meals.   insulin aspart 100 UNIT/ML injection  Commonly known as:  novoLOG CBG 70 - 120: 0 units CBG 121 - 150: 1 unit CBG 151 - 200: 2 units CBG 201 - 250: 3 units CBG 251 - 300: 5 units CBG 301 - 350: 7 units CBG 351 - 400: 9 units   levofloxacin 750 MG tablet Commonly known as:  LEVAQUIN Take 1 tablet (750 mg total) by mouth daily for 3 days.   metFORMIN 500 MG 24 hr tablet Commonly known as:   GLUCOPHAGE-XR Take 2,000 mg by mouth daily.   minocycline 50 MG capsule Commonly known as:  MINOCIN,DYNACIN Take 50 mg by mouth daily.   nicotine 21 mg/24hr patch Commonly known as:  NICODERM CQ - dosed in mg/24 hours Place 1 patch (21 mg total) onto the skin daily. Start taking on:  March 16, 2018   predniSONE 20 MG tablet Commonly known as:  DELTASONE Prednisone 60 mg daily for 2 days followed by  Prednisone 40 mg daily for 3 days followed by  Prednisone 20 mg daily for 3 days.   senna-docusate 8.6-50 MG tablet Commonly known as:  Senokot-S Take 1 tablet by mouth at bedtime as needed for mild constipation.   simvastatin 20 MG tablet Commonly known as:  ZOCOR Take 20 mg by mouth at bedtime.   TURMERIC PO Take 1,350 mg by mouth daily.   Vitamin D 50 MCG (2000 UT) Caps Take 2,000 Units by mouth daily.       Allergies  Allergen Reactions  . Penicillins     Causes yeast infection    Consultations:  None.    Procedures/Studies: Dg Chest 2 View  Result Date: 03/09/2018 CLINICAL DATA:  Cough, congestion EXAM: CHEST - 2 VIEW COMPARISON:  10/14/2009 FINDINGS: There is hyperinflation of the lungs compatible with COPD. Peribronchial thickening and interstitial prominence. Heart is borderline in size. No effusions or acute bony abnormality. IMPRESSION: COPD. Peribronchial thickening and interstitial prominence could reflect bronchitic changes or interstitial edema. Electronically Signed   By: Charlett NoseKevin  Dover M.D.   On: 03/09/2018 09:52   Ct Head Wo Contrast  Result Date: 03/10/2018 CLINICAL DATA:  Fall today.  Head trauma EXAM: CT HEAD WITHOUT CONTRAST TECHNIQUE: Contiguous axial images were obtained from the base of the skull through the vertex without intravenous contrast. COMPARISON:  None. FINDINGS: Brain: Ventricle size normal. Negative for acute infarct, hemorrhage, mass. Mild chronic appearing white matter changes. Punctate calcification right parietal lobe appears  benign. Vascular: Negative for hyperdense vessel Skull: Negative for fracture Sinuses/Orbits: Mild mucosal edema paranasal sinuses. Bilateral cataract surgery. Other: None IMPRESSION: No acute abnormality. Electronically Signed   By: Marlan Palauharles  Clark M.D.   On: 03/10/2018 17:37   Ct Angio Chest Pe W Or Wo Contrast  Result Date: 03/10/2018 CLINICAL DATA:  Acute respiratory failure. COPD. Swelling and tenderness of the right leg. Recent 8 hour train ride. EXAM: CT ANGIOGRAPHY CHEST WITH CONTRAST TECHNIQUE: Multidetector CT imaging of the chest was performed using the standard protocol during bolus administration of intravenous contrast. Multiplanar CT image reconstructions and MIPs were obtained to evaluate the vascular anatomy. CONTRAST:  100mL ISOVUE-370 IOPAMIDOL (ISOVUE-370) INJECTION 76% COMPARISON:  None. FINDINGS: Cardiovascular: Good opacification of the central and segmental pulmonary arteries. No focal filling defects. No evidence of significant pulmonary embolus. Mild enlargement of the main pulmonary artery may reflect pulmonary arterial hypertension in the appropriate clinical setting. Normal caliber thoracic aorta with scattered calcification. No aortic dissection. Great vessel origins are patent. Normal heart size. No pericardial effusions. Calcification in  the mitral valve annulus and coronary arteries. Mediastinum/Nodes: Esophagus is decompressed. Mild prominence of mediastinal lymph nodes diffusely without pathologic enlargement, likely reactive. Lungs/Pleura: Motion artifact limits examination. Minimal bilateral pleural effusions with basilar atelectasis. Suggestion of patchy tree-in-bud infiltrates in the upper lungs bilaterally suggesting bronchopneumonia or airways disease. Airways are patent without evidence of mucous plugging. No pneumothorax. Upper Abdomen: Bilateral adrenal gland nodules with low-attenuation change suggesting benign adenomas. Musculoskeletal: Degenerative changes in the  spine. Mild superior endplate depression at a midthoracic vertebra, likely chronic. Review of the MIP images confirms the above findings. IMPRESSION: 1. No evidence of significant pulmonary embolus. 2. Minimal bilateral pleural effusions with basilar atelectasis. 3. Patchy tree-in-bud infiltrates in the upper lungs bilaterally suggesting bronchopneumonia or airways disease. 4. Bilateral adrenal gland nodules with low-attenuation change suggesting benign adenomas. Aortic Atherosclerosis (ICD10-I70.0). Electronically Signed   By: Burman Nieves M.D.   On: 03/10/2018 22:52   Dg Chest Port 1 View  Result Date: 03/10/2018 CLINICAL DATA:  Shortness of breath. EXAM: PORTABLE CHEST 1 VIEW COMPARISON:  Radiographs of March 09, 2018. FINDINGS: Stable cardiomediastinal silhouette. Atherosclerosis of thoracic aorta is noted. No pneumothorax or pleural effusion is noted. Stable interstitial densities are noted throughout both lungs which may represent chronic interstitial lung disease, but superimposed edema or inflammation could not be excluded. Bony thorax is unremarkable. IMPRESSION: Stable interstitial densities are noted throughout both lungs which may represent chronic interstitial lung disease or scarring, but superimposed acute edema or inflammation can not be excluded. Aortic Atherosclerosis (ICD10-I70.0). Electronically Signed   By: Lupita Raider, M.D.   On: 03/10/2018 16:09   Dg Foot Complete Right  Result Date: 03/10/2018 CLINICAL DATA:  Right foot pain EXAM: RIGHT FOOT COMPLETE - 3+ VIEW COMPARISON:  CT right foot 04/22/2012 FINDINGS: Deformity of the head of the right first metatarsal is likely related to remote trauma. No acute fracture or dislocation. Bone density is normal. Mild soft tissue swelling without soft tissue gas. IMPRESSION: No acute fracture or dislocation. Electronically Signed   By: Deatra Robinson M.D.   On: 03/10/2018 21:51   US Abdomen Limited Ruq  Result Date:  03/11/2018 CLINICAL DATA:  Elevated transaminase levels EXAM: ULTRASOUND ABDOMEN LIMITED RIGHT UPPER QUADRANT COMPARISON:  None. FINDINGS: Gallbladder: There is a 3 mm non mobile gallstone at the gallbladder neck. No gallbladder wall thickening or pericholecystic fluid. A negative sonographic Eulah Pont sign was reported by the sonographer. Common bile duct: Diameter: 1 mm Liver: No focal lesion identified. Within normal limits in parenchymal echogenicity. Portal vein is patent on color Doppler imaging with normal direction of blood flow towards the liver. IMPRESSION: Cholelithiasis without other evidence of acute cholecystitis. No biliary dilatation. Electronically Signed   By: Deatra Robinson M.D.   On: 03/11/2018 01:45       Subjective: Currently on 3 lit of [place] oxygen.   Discharge Exam: Vitals:   03/15/18 0753 03/15/18 0818  BP:  (!) 145/97  Pulse:  90  Resp:  18  Temp:  98.4 F (36.9 C)  SpO2: 98% 94%   Vitals:   03/14/18 2106 03/15/18 0446 03/15/18 0753 03/15/18 0818  BP:  126/82  (!) 145/97  Pulse:  75  90  Resp: 20 16  18   Temp: 98.3 F (36.8 C) 98.2 F (36.8 C)  98.4 F (36.9 C)  TempSrc: Oral Oral  Oral  SpO2:  99% 98% 94%  Weight:  61.4 kg    Height:        General:  Pt is alert, awake, not in acute distress Cardiovascular: RRR, S1/S2 +, no rubs, no gallops Respiratory: CTA bilaterally, no wheezing, no rhonchi Abdominal: Soft, NT, ND, bowel sounds + Extremities: no edema, no cyanosis    The results of significant diagnostics from this hospitalization (including imaging, microbiology, ancillary and laboratory) are listed below for reference.     Microbiology: No results found for this or any previous visit (from the past 240 hour(s)).   Labs: BNP (last 3 results) No results for input(s): BNP in the last 8760 hours. Basic Metabolic Panel: Recent Labs  Lab 03/10/18 1643 03/10/18 2351 03/12/18 0253 03/13/18 1342  NA 141 143 142 142  K 5.4* 5.1 5.2* 4.7  CL  105 102 101 99  CO2 26 31 34* 37*  GLUCOSE 162* 154* 172* 274*  BUN 22 22 22 22   CREATININE 0.68 0.67 0.49 0.63  CALCIUM 9.5 9.3 8.9 9.0   Liver Function Tests: Recent Labs  Lab 03/10/18 1643 03/10/18 2351 03/12/18 0253 03/13/18 1342  AST 1,928* 2,893* 973* 220*  ALT 695* 920* 723* 429*  ALKPHOS 71 69 63 58  BILITOT 0.6 0.7 0.3 0.8  PROT 5.9* 6.1* 6.0* 5.7*  ALBUMIN 3.0* 3.0* 2.8* 2.9*   No results for input(s): LIPASE, AMYLASE in the last 168 hours. Recent Labs  Lab 03/10/18 2350  AMMONIA 58*   CBC: Recent Labs  Lab 03/10/18 1500 03/10/18 2351 03/12/18 0253  WBC 3.9* 4.6 8.2  NEUTROABS 3.0 3.7  --   HGB 12.5 11.9* 12.0  HCT 44.4 42.2 42.1  MCV 95.9 92.7 94.6  PLT 160 213 204   Cardiac Enzymes: No results for input(s): CKTOTAL, CKMB, CKMBINDEX, TROPONINI in the last 168 hours. BNP: Invalid input(s): POCBNP CBG: Recent Labs  Lab 03/14/18 1122 03/14/18 1607 03/14/18 2147 03/15/18 0622 03/15/18 1054  GLUCAP 183* 175* 122* 161* 105*   D-Dimer No results for input(s): DDIMER in the last 72 hours. Hgb A1c Recent Labs    03/13/18 0251  HGBA1C 6.2*   Lipid Profile No results for input(s): CHOL, HDL, LDLCALC, TRIG, CHOLHDL, LDLDIRECT in the last 72 hours. Thyroid function studies No results for input(s): TSH, T4TOTAL, T3FREE, THYROIDAB in the last 72 hours.  Invalid input(s): FREET3 Anemia work up No results for input(s): VITAMINB12, FOLATE, FERRITIN, TIBC, IRON, RETICCTPCT in the last 72 hours. Urinalysis No results found for: COLORURINE, APPEARANCEUR, LABSPEC, PHURINE, GLUCOSEU, HGBUR, BILIRUBINUR, KETONESUR, PROTEINUR, UROBILINOGEN, NITRITE, LEUKOCYTESUR Sepsis Labs Invalid input(s): PROCALCITONIN,  WBC,  LACTICIDVEN Microbiology No results found for this or any previous visit (from the past 240 hour(s)).   Time coordinating discharge: 36 minutes  SIGNED:   Kathlen Mody, MD  [facility] 03/15/2018, 11:58 AM Pager   If 7PM-7AM,  please contact night-coverage [email or web address] Password [email or web address]

## 2018-03-21 DIAGNOSIS — J441 Chronic obstructive pulmonary disease with (acute) exacerbation: Secondary | ICD-10-CM | POA: Diagnosis not present

## 2018-03-21 DIAGNOSIS — E119 Type 2 diabetes mellitus without complications: Secondary | ICD-10-CM | POA: Diagnosis not present

## 2018-03-21 DIAGNOSIS — D638 Anemia in other chronic diseases classified elsewhere: Secondary | ICD-10-CM | POA: Diagnosis not present

## 2018-03-21 DIAGNOSIS — D751 Secondary polycythemia: Secondary | ICD-10-CM | POA: Diagnosis not present

## 2018-03-23 DIAGNOSIS — E119 Type 2 diabetes mellitus without complications: Secondary | ICD-10-CM | POA: Diagnosis not present

## 2018-03-23 DIAGNOSIS — R2689 Other abnormalities of gait and mobility: Secondary | ICD-10-CM | POA: Diagnosis not present

## 2018-03-23 DIAGNOSIS — J441 Chronic obstructive pulmonary disease with (acute) exacerbation: Secondary | ICD-10-CM | POA: Diagnosis not present

## 2018-03-23 DIAGNOSIS — M6281 Muscle weakness (generalized): Secondary | ICD-10-CM | POA: Diagnosis not present

## 2018-03-23 DIAGNOSIS — D638 Anemia in other chronic diseases classified elsewhere: Secondary | ICD-10-CM | POA: Diagnosis not present

## 2018-03-23 DIAGNOSIS — R278 Other lack of coordination: Secondary | ICD-10-CM | POA: Diagnosis not present

## 2018-03-23 DIAGNOSIS — J449 Chronic obstructive pulmonary disease, unspecified: Secondary | ICD-10-CM | POA: Diagnosis not present

## 2018-03-23 DIAGNOSIS — R740 Nonspecific elevation of levels of transaminase and lactic acid dehydrogenase [LDH]: Secondary | ICD-10-CM | POA: Diagnosis not present

## 2018-03-23 DIAGNOSIS — E785 Hyperlipidemia, unspecified: Secondary | ICD-10-CM | POA: Diagnosis not present

## 2018-03-23 DIAGNOSIS — D751 Secondary polycythemia: Secondary | ICD-10-CM | POA: Diagnosis not present

## 2018-03-23 DIAGNOSIS — J9602 Acute respiratory failure with hypercapnia: Secondary | ICD-10-CM | POA: Diagnosis not present

## 2018-03-28 DIAGNOSIS — D751 Secondary polycythemia: Secondary | ICD-10-CM | POA: Diagnosis not present

## 2018-03-28 DIAGNOSIS — J441 Chronic obstructive pulmonary disease with (acute) exacerbation: Secondary | ICD-10-CM | POA: Diagnosis not present

## 2018-03-28 DIAGNOSIS — R740 Nonspecific elevation of levels of transaminase and lactic acid dehydrogenase [LDH]: Secondary | ICD-10-CM | POA: Diagnosis not present

## 2018-03-28 DIAGNOSIS — E119 Type 2 diabetes mellitus without complications: Secondary | ICD-10-CM | POA: Diagnosis not present

## 2018-03-31 DIAGNOSIS — J439 Emphysema, unspecified: Secondary | ICD-10-CM | POA: Diagnosis not present

## 2018-03-31 DIAGNOSIS — J9601 Acute respiratory failure with hypoxia: Secondary | ICD-10-CM | POA: Diagnosis not present

## 2018-03-31 DIAGNOSIS — E7800 Pure hypercholesterolemia, unspecified: Secondary | ICD-10-CM | POA: Diagnosis not present

## 2018-03-31 DIAGNOSIS — Z09 Encounter for follow-up examination after completed treatment for conditions other than malignant neoplasm: Secondary | ICD-10-CM | POA: Diagnosis not present

## 2018-03-31 DIAGNOSIS — J180 Bronchopneumonia, unspecified organism: Secondary | ICD-10-CM | POA: Diagnosis not present

## 2018-03-31 DIAGNOSIS — J441 Chronic obstructive pulmonary disease with (acute) exacerbation: Secondary | ICD-10-CM | POA: Diagnosis not present

## 2018-03-31 DIAGNOSIS — E1169 Type 2 diabetes mellitus with other specified complication: Secondary | ICD-10-CM | POA: Diagnosis not present

## 2018-04-11 DIAGNOSIS — S62609A Fracture of unspecified phalanx of unspecified finger, initial encounter for closed fracture: Secondary | ICD-10-CM | POA: Diagnosis not present

## 2018-04-11 DIAGNOSIS — S62109A Fracture of unspecified carpal bone, unspecified wrist, initial encounter for closed fracture: Secondary | ICD-10-CM | POA: Diagnosis not present

## 2018-04-11 DIAGNOSIS — M25532 Pain in left wrist: Secondary | ICD-10-CM | POA: Diagnosis not present

## 2018-04-18 DIAGNOSIS — H527 Unspecified disorder of refraction: Secondary | ICD-10-CM | POA: Diagnosis not present

## 2018-04-18 DIAGNOSIS — H04123 Dry eye syndrome of bilateral lacrimal glands: Secondary | ICD-10-CM | POA: Diagnosis not present

## 2018-04-18 DIAGNOSIS — E119 Type 2 diabetes mellitus without complications: Secondary | ICD-10-CM | POA: Diagnosis not present

## 2018-04-18 DIAGNOSIS — H538 Other visual disturbances: Secondary | ICD-10-CM | POA: Diagnosis not present

## 2018-04-18 DIAGNOSIS — H53002 Unspecified amblyopia, left eye: Secondary | ICD-10-CM | POA: Diagnosis not present

## 2018-04-18 DIAGNOSIS — H5040 Unspecified heterotropia: Secondary | ICD-10-CM | POA: Diagnosis not present

## 2018-04-19 DIAGNOSIS — S62609A Fracture of unspecified phalanx of unspecified finger, initial encounter for closed fracture: Secondary | ICD-10-CM | POA: Diagnosis not present

## 2018-04-19 DIAGNOSIS — S62109A Fracture of unspecified carpal bone, unspecified wrist, initial encounter for closed fracture: Secondary | ICD-10-CM | POA: Diagnosis not present

## 2018-04-19 DIAGNOSIS — M25532 Pain in left wrist: Secondary | ICD-10-CM | POA: Diagnosis not present

## 2018-04-21 DIAGNOSIS — S62617A Displaced fracture of proximal phalanx of left little finger, initial encounter for closed fracture: Secondary | ICD-10-CM | POA: Diagnosis not present

## 2018-04-21 DIAGNOSIS — Z79899 Other long term (current) drug therapy: Secondary | ICD-10-CM | POA: Diagnosis not present

## 2018-04-21 DIAGNOSIS — S62615A Displaced fracture of proximal phalanx of left ring finger, initial encounter for closed fracture: Secondary | ICD-10-CM | POA: Diagnosis not present

## 2018-04-21 DIAGNOSIS — S52552A Other extraarticular fracture of lower end of left radius, initial encounter for closed fracture: Secondary | ICD-10-CM | POA: Diagnosis not present

## 2018-04-21 DIAGNOSIS — E785 Hyperlipidemia, unspecified: Secondary | ICD-10-CM | POA: Diagnosis not present

## 2018-04-21 DIAGNOSIS — Z7984 Long term (current) use of oral hypoglycemic drugs: Secondary | ICD-10-CM | POA: Diagnosis not present

## 2018-04-21 DIAGNOSIS — E119 Type 2 diabetes mellitus without complications: Secondary | ICD-10-CM | POA: Diagnosis not present

## 2018-04-21 DIAGNOSIS — Z87891 Personal history of nicotine dependence: Secondary | ICD-10-CM | POA: Diagnosis not present

## 2018-04-21 DIAGNOSIS — G4733 Obstructive sleep apnea (adult) (pediatric): Secondary | ICD-10-CM | POA: Diagnosis not present

## 2018-04-27 DIAGNOSIS — M25532 Pain in left wrist: Secondary | ICD-10-CM | POA: Diagnosis not present

## 2018-04-27 DIAGNOSIS — S62109A Fracture of unspecified carpal bone, unspecified wrist, initial encounter for closed fracture: Secondary | ICD-10-CM | POA: Diagnosis not present

## 2018-04-27 DIAGNOSIS — M79645 Pain in left finger(s): Secondary | ICD-10-CM | POA: Diagnosis not present

## 2018-04-27 DIAGNOSIS — M25642 Stiffness of left hand, not elsewhere classified: Secondary | ICD-10-CM | POA: Diagnosis not present

## 2018-05-02 DIAGNOSIS — R911 Solitary pulmonary nodule: Secondary | ICD-10-CM | POA: Diagnosis not present

## 2018-05-02 DIAGNOSIS — J449 Chronic obstructive pulmonary disease, unspecified: Secondary | ICD-10-CM | POA: Diagnosis not present

## 2018-05-02 DIAGNOSIS — R05 Cough: Secondary | ICD-10-CM | POA: Diagnosis not present

## 2018-05-03 DIAGNOSIS — M79645 Pain in left finger(s): Secondary | ICD-10-CM | POA: Diagnosis not present

## 2018-05-03 DIAGNOSIS — M25642 Stiffness of left hand, not elsewhere classified: Secondary | ICD-10-CM | POA: Diagnosis not present

## 2018-05-03 DIAGNOSIS — S62109A Fracture of unspecified carpal bone, unspecified wrist, initial encounter for closed fracture: Secondary | ICD-10-CM | POA: Diagnosis not present

## 2018-05-03 DIAGNOSIS — M25532 Pain in left wrist: Secondary | ICD-10-CM | POA: Diagnosis not present

## 2018-05-09 DIAGNOSIS — Z87891 Personal history of nicotine dependence: Secondary | ICD-10-CM | POA: Diagnosis not present

## 2018-05-12 DIAGNOSIS — S62109A Fracture of unspecified carpal bone, unspecified wrist, initial encounter for closed fracture: Secondary | ICD-10-CM | POA: Diagnosis not present

## 2018-05-12 DIAGNOSIS — M25642 Stiffness of left hand, not elsewhere classified: Secondary | ICD-10-CM | POA: Diagnosis not present

## 2018-05-12 DIAGNOSIS — M79645 Pain in left finger(s): Secondary | ICD-10-CM | POA: Diagnosis not present

## 2018-05-12 DIAGNOSIS — M25532 Pain in left wrist: Secondary | ICD-10-CM | POA: Diagnosis not present

## 2018-05-19 DIAGNOSIS — M25532 Pain in left wrist: Secondary | ICD-10-CM | POA: Diagnosis not present

## 2018-05-19 DIAGNOSIS — M25642 Stiffness of left hand, not elsewhere classified: Secondary | ICD-10-CM | POA: Diagnosis not present

## 2018-05-19 DIAGNOSIS — M79645 Pain in left finger(s): Secondary | ICD-10-CM | POA: Diagnosis not present

## 2018-05-19 DIAGNOSIS — S62109A Fracture of unspecified carpal bone, unspecified wrist, initial encounter for closed fracture: Secondary | ICD-10-CM | POA: Diagnosis not present

## 2018-05-20 DIAGNOSIS — R911 Solitary pulmonary nodule: Secondary | ICD-10-CM | POA: Diagnosis not present

## 2018-05-20 DIAGNOSIS — J449 Chronic obstructive pulmonary disease, unspecified: Secondary | ICD-10-CM | POA: Diagnosis not present

## 2018-05-24 DIAGNOSIS — S62109A Fracture of unspecified carpal bone, unspecified wrist, initial encounter for closed fracture: Secondary | ICD-10-CM | POA: Diagnosis not present

## 2018-05-24 DIAGNOSIS — M79645 Pain in left finger(s): Secondary | ICD-10-CM | POA: Diagnosis not present

## 2018-05-24 DIAGNOSIS — M25642 Stiffness of left hand, not elsewhere classified: Secondary | ICD-10-CM | POA: Diagnosis not present

## 2018-05-24 DIAGNOSIS — M25532 Pain in left wrist: Secondary | ICD-10-CM | POA: Diagnosis not present

## 2018-05-31 DIAGNOSIS — S62109A Fracture of unspecified carpal bone, unspecified wrist, initial encounter for closed fracture: Secondary | ICD-10-CM | POA: Diagnosis not present

## 2018-05-31 DIAGNOSIS — M25642 Stiffness of left hand, not elsewhere classified: Secondary | ICD-10-CM | POA: Diagnosis not present

## 2018-05-31 DIAGNOSIS — M79645 Pain in left finger(s): Secondary | ICD-10-CM | POA: Diagnosis not present

## 2018-06-09 DIAGNOSIS — S62109A Fracture of unspecified carpal bone, unspecified wrist, initial encounter for closed fracture: Secondary | ICD-10-CM | POA: Diagnosis not present

## 2018-06-09 DIAGNOSIS — M79645 Pain in left finger(s): Secondary | ICD-10-CM | POA: Diagnosis not present

## 2018-06-09 DIAGNOSIS — M25642 Stiffness of left hand, not elsewhere classified: Secondary | ICD-10-CM | POA: Diagnosis not present

## 2018-06-23 DIAGNOSIS — S62109A Fracture of unspecified carpal bone, unspecified wrist, initial encounter for closed fracture: Secondary | ICD-10-CM | POA: Diagnosis not present

## 2018-06-23 DIAGNOSIS — M25642 Stiffness of left hand, not elsewhere classified: Secondary | ICD-10-CM | POA: Diagnosis not present

## 2018-06-23 DIAGNOSIS — M25532 Pain in left wrist: Secondary | ICD-10-CM | POA: Diagnosis not present

## 2018-06-23 DIAGNOSIS — M79645 Pain in left finger(s): Secondary | ICD-10-CM | POA: Diagnosis not present

## 2018-06-28 DIAGNOSIS — M25532 Pain in left wrist: Secondary | ICD-10-CM | POA: Diagnosis not present

## 2018-06-29 DIAGNOSIS — M810 Age-related osteoporosis without current pathological fracture: Secondary | ICD-10-CM | POA: Diagnosis not present

## 2018-06-29 DIAGNOSIS — J439 Emphysema, unspecified: Secondary | ICD-10-CM | POA: Diagnosis not present

## 2018-06-29 DIAGNOSIS — E7800 Pure hypercholesterolemia, unspecified: Secondary | ICD-10-CM | POA: Diagnosis not present

## 2018-06-29 DIAGNOSIS — E1169 Type 2 diabetes mellitus with other specified complication: Secondary | ICD-10-CM | POA: Diagnosis not present

## 2018-06-30 DIAGNOSIS — M25642 Stiffness of left hand, not elsewhere classified: Secondary | ICD-10-CM | POA: Diagnosis not present

## 2018-06-30 DIAGNOSIS — M25532 Pain in left wrist: Secondary | ICD-10-CM | POA: Diagnosis not present

## 2018-06-30 DIAGNOSIS — S62109A Fracture of unspecified carpal bone, unspecified wrist, initial encounter for closed fracture: Secondary | ICD-10-CM | POA: Diagnosis not present

## 2018-06-30 DIAGNOSIS — M79645 Pain in left finger(s): Secondary | ICD-10-CM | POA: Diagnosis not present

## 2018-07-07 DIAGNOSIS — S62109A Fracture of unspecified carpal bone, unspecified wrist, initial encounter for closed fracture: Secondary | ICD-10-CM | POA: Diagnosis not present

## 2018-07-07 DIAGNOSIS — M79645 Pain in left finger(s): Secondary | ICD-10-CM | POA: Diagnosis not present

## 2018-07-07 DIAGNOSIS — M25532 Pain in left wrist: Secondary | ICD-10-CM | POA: Diagnosis not present

## 2018-07-07 DIAGNOSIS — M25642 Stiffness of left hand, not elsewhere classified: Secondary | ICD-10-CM | POA: Diagnosis not present

## 2018-07-21 DIAGNOSIS — S62109A Fracture of unspecified carpal bone, unspecified wrist, initial encounter for closed fracture: Secondary | ICD-10-CM | POA: Diagnosis not present

## 2018-07-21 DIAGNOSIS — M25642 Stiffness of left hand, not elsewhere classified: Secondary | ICD-10-CM | POA: Diagnosis not present

## 2018-07-21 DIAGNOSIS — M79645 Pain in left finger(s): Secondary | ICD-10-CM | POA: Diagnosis not present

## 2018-07-21 DIAGNOSIS — M25532 Pain in left wrist: Secondary | ICD-10-CM | POA: Diagnosis not present

## 2018-08-04 DIAGNOSIS — M25642 Stiffness of left hand, not elsewhere classified: Secondary | ICD-10-CM | POA: Diagnosis not present

## 2018-08-04 DIAGNOSIS — M25532 Pain in left wrist: Secondary | ICD-10-CM | POA: Diagnosis not present

## 2018-08-04 DIAGNOSIS — S62109A Fracture of unspecified carpal bone, unspecified wrist, initial encounter for closed fracture: Secondary | ICD-10-CM | POA: Diagnosis not present

## 2018-08-04 DIAGNOSIS — M79645 Pain in left finger(s): Secondary | ICD-10-CM | POA: Diagnosis not present

## 2018-08-10 DIAGNOSIS — Z720 Tobacco use: Secondary | ICD-10-CM | POA: Diagnosis not present

## 2018-08-10 DIAGNOSIS — R931 Abnormal findings on diagnostic imaging of heart and coronary circulation: Secondary | ICD-10-CM | POA: Diagnosis not present

## 2018-08-10 DIAGNOSIS — M810 Age-related osteoporosis without current pathological fracture: Secondary | ICD-10-CM | POA: Diagnosis not present

## 2018-08-10 DIAGNOSIS — Z7982 Long term (current) use of aspirin: Secondary | ICD-10-CM | POA: Diagnosis not present

## 2018-08-10 DIAGNOSIS — R0902 Hypoxemia: Secondary | ICD-10-CM | POA: Diagnosis not present

## 2018-08-10 DIAGNOSIS — I10 Essential (primary) hypertension: Secondary | ICD-10-CM | POA: Diagnosis not present

## 2018-08-10 DIAGNOSIS — I058 Other rheumatic mitral valve diseases: Secondary | ICD-10-CM | POA: Diagnosis not present

## 2018-08-10 DIAGNOSIS — E785 Hyperlipidemia, unspecified: Secondary | ICD-10-CM | POA: Diagnosis not present

## 2018-08-10 DIAGNOSIS — Z7951 Long term (current) use of inhaled steroids: Secondary | ICD-10-CM | POA: Diagnosis not present

## 2018-08-10 DIAGNOSIS — R0602 Shortness of breath: Secondary | ICD-10-CM | POA: Diagnosis not present

## 2018-08-10 DIAGNOSIS — J9602 Acute respiratory failure with hypercapnia: Secondary | ICD-10-CM | POA: Diagnosis not present

## 2018-08-10 DIAGNOSIS — J984 Other disorders of lung: Secondary | ICD-10-CM | POA: Diagnosis not present

## 2018-08-10 DIAGNOSIS — Z7984 Long term (current) use of oral hypoglycemic drugs: Secondary | ICD-10-CM | POA: Diagnosis not present

## 2018-08-10 DIAGNOSIS — J9601 Acute respiratory failure with hypoxia: Secondary | ICD-10-CM | POA: Diagnosis not present

## 2018-08-10 DIAGNOSIS — R069 Unspecified abnormalities of breathing: Secondary | ICD-10-CM | POA: Diagnosis not present

## 2018-08-10 DIAGNOSIS — J441 Chronic obstructive pulmonary disease with (acute) exacerbation: Secondary | ICD-10-CM | POA: Diagnosis not present

## 2018-08-10 DIAGNOSIS — I519 Heart disease, unspecified: Secondary | ICD-10-CM | POA: Diagnosis not present

## 2018-08-10 DIAGNOSIS — I517 Cardiomegaly: Secondary | ICD-10-CM | POA: Diagnosis not present

## 2018-08-10 DIAGNOSIS — I878 Other specified disorders of veins: Secondary | ICD-10-CM | POA: Diagnosis not present

## 2018-08-10 DIAGNOSIS — Z79891 Long term (current) use of opiate analgesic: Secondary | ICD-10-CM | POA: Diagnosis not present

## 2018-08-10 DIAGNOSIS — I080 Rheumatic disorders of both mitral and aortic valves: Secondary | ICD-10-CM | POA: Diagnosis not present

## 2018-08-10 DIAGNOSIS — Z79899 Other long term (current) drug therapy: Secondary | ICD-10-CM | POA: Diagnosis not present

## 2018-08-10 DIAGNOSIS — E119 Type 2 diabetes mellitus without complications: Secondary | ICD-10-CM | POA: Diagnosis not present

## 2018-08-10 DIAGNOSIS — I059 Rheumatic mitral valve disease, unspecified: Secondary | ICD-10-CM | POA: Diagnosis not present

## 2018-08-18 DIAGNOSIS — J449 Chronic obstructive pulmonary disease, unspecified: Secondary | ICD-10-CM | POA: Diagnosis not present

## 2018-08-18 DIAGNOSIS — J3089 Other allergic rhinitis: Secondary | ICD-10-CM | POA: Diagnosis not present

## 2018-08-18 DIAGNOSIS — R911 Solitary pulmonary nodule: Secondary | ICD-10-CM | POA: Diagnosis not present

## 2018-08-18 DIAGNOSIS — R05 Cough: Secondary | ICD-10-CM | POA: Diagnosis not present

## 2018-08-25 DIAGNOSIS — E7800 Pure hypercholesterolemia, unspecified: Secondary | ICD-10-CM | POA: Diagnosis not present

## 2018-08-25 DIAGNOSIS — J441 Chronic obstructive pulmonary disease with (acute) exacerbation: Secondary | ICD-10-CM | POA: Diagnosis not present

## 2018-08-25 DIAGNOSIS — Z720 Tobacco use: Secondary | ICD-10-CM | POA: Diagnosis not present

## 2018-08-25 DIAGNOSIS — J9602 Acute respiratory failure with hypercapnia: Secondary | ICD-10-CM | POA: Diagnosis not present

## 2018-08-25 DIAGNOSIS — E1169 Type 2 diabetes mellitus with other specified complication: Secondary | ICD-10-CM | POA: Diagnosis not present

## 2018-09-06 ENCOUNTER — Other Ambulatory Visit: Payer: Self-pay | Admitting: Unknown Physician Specialty

## 2018-09-06 DIAGNOSIS — Z780 Asymptomatic menopausal state: Secondary | ICD-10-CM

## 2018-09-08 DIAGNOSIS — R0683 Snoring: Secondary | ICD-10-CM | POA: Diagnosis not present

## 2018-09-08 DIAGNOSIS — R911 Solitary pulmonary nodule: Secondary | ICD-10-CM | POA: Diagnosis not present

## 2018-09-08 DIAGNOSIS — J449 Chronic obstructive pulmonary disease, unspecified: Secondary | ICD-10-CM | POA: Diagnosis not present

## 2018-09-08 DIAGNOSIS — J3089 Other allergic rhinitis: Secondary | ICD-10-CM | POA: Diagnosis not present

## 2018-09-12 DIAGNOSIS — R0683 Snoring: Secondary | ICD-10-CM | POA: Diagnosis not present

## 2018-09-12 DIAGNOSIS — G4710 Hypersomnia, unspecified: Secondary | ICD-10-CM | POA: Diagnosis not present

## 2018-09-14 ENCOUNTER — Other Ambulatory Visit: Payer: Self-pay

## 2018-09-14 ENCOUNTER — Ambulatory Visit (INDEPENDENT_AMBULATORY_CARE_PROVIDER_SITE_OTHER): Payer: Medicare Other

## 2018-09-14 DIAGNOSIS — Z780 Asymptomatic menopausal state: Secondary | ICD-10-CM | POA: Diagnosis not present

## 2018-09-14 DIAGNOSIS — M810 Age-related osteoporosis without current pathological fracture: Secondary | ICD-10-CM | POA: Diagnosis not present

## 2018-09-20 DIAGNOSIS — J9611 Chronic respiratory failure with hypoxia: Secondary | ICD-10-CM | POA: Diagnosis not present

## 2018-09-20 DIAGNOSIS — J449 Chronic obstructive pulmonary disease, unspecified: Secondary | ICD-10-CM | POA: Diagnosis not present

## 2018-09-20 DIAGNOSIS — R0602 Shortness of breath: Secondary | ICD-10-CM | POA: Diagnosis not present

## 2018-09-20 DIAGNOSIS — R911 Solitary pulmonary nodule: Secondary | ICD-10-CM | POA: Diagnosis not present

## 2018-09-21 DIAGNOSIS — J449 Chronic obstructive pulmonary disease, unspecified: Secondary | ICD-10-CM | POA: Diagnosis not present

## 2018-09-21 DIAGNOSIS — J9611 Chronic respiratory failure with hypoxia: Secondary | ICD-10-CM | POA: Diagnosis not present

## 2018-09-21 DIAGNOSIS — R0602 Shortness of breath: Secondary | ICD-10-CM | POA: Diagnosis not present

## 2018-10-03 DIAGNOSIS — J3089 Other allergic rhinitis: Secondary | ICD-10-CM | POA: Diagnosis not present

## 2018-10-03 DIAGNOSIS — J9611 Chronic respiratory failure with hypoxia: Secondary | ICD-10-CM | POA: Diagnosis not present

## 2018-10-03 DIAGNOSIS — J449 Chronic obstructive pulmonary disease, unspecified: Secondary | ICD-10-CM | POA: Diagnosis not present

## 2018-10-06 DIAGNOSIS — J449 Chronic obstructive pulmonary disease, unspecified: Secondary | ICD-10-CM | POA: Diagnosis not present

## 2018-11-06 DIAGNOSIS — J449 Chronic obstructive pulmonary disease, unspecified: Secondary | ICD-10-CM | POA: Diagnosis not present

## 2018-11-10 DIAGNOSIS — E559 Vitamin D deficiency, unspecified: Secondary | ICD-10-CM | POA: Diagnosis not present

## 2018-11-10 DIAGNOSIS — M810 Age-related osteoporosis without current pathological fracture: Secondary | ICD-10-CM | POA: Diagnosis not present

## 2018-11-10 DIAGNOSIS — Z1231 Encounter for screening mammogram for malignant neoplasm of breast: Secondary | ICD-10-CM | POA: Diagnosis not present

## 2018-11-10 DIAGNOSIS — M899 Disorder of bone, unspecified: Secondary | ICD-10-CM | POA: Diagnosis not present

## 2018-11-10 DIAGNOSIS — E7800 Pure hypercholesterolemia, unspecified: Secondary | ICD-10-CM | POA: Diagnosis not present

## 2018-11-10 DIAGNOSIS — R5383 Other fatigue: Secondary | ICD-10-CM | POA: Diagnosis not present

## 2018-11-10 DIAGNOSIS — J439 Emphysema, unspecified: Secondary | ICD-10-CM | POA: Diagnosis not present

## 2018-11-10 DIAGNOSIS — E1169 Type 2 diabetes mellitus with other specified complication: Secondary | ICD-10-CM | POA: Diagnosis not present

## 2018-11-10 DIAGNOSIS — Z0000 Encounter for general adult medical examination without abnormal findings: Secondary | ICD-10-CM | POA: Diagnosis not present

## 2018-11-15 ENCOUNTER — Other Ambulatory Visit: Payer: Self-pay | Admitting: Unknown Physician Specialty

## 2018-11-15 DIAGNOSIS — Z0000 Encounter for general adult medical examination without abnormal findings: Secondary | ICD-10-CM

## 2018-11-22 DIAGNOSIS — Z7984 Long term (current) use of oral hypoglycemic drugs: Secondary | ICD-10-CM | POA: Diagnosis not present

## 2018-11-22 DIAGNOSIS — E119 Type 2 diabetes mellitus without complications: Secondary | ICD-10-CM | POA: Diagnosis not present

## 2018-11-22 DIAGNOSIS — G8911 Acute pain due to trauma: Secondary | ICD-10-CM | POA: Diagnosis not present

## 2018-11-22 DIAGNOSIS — G4733 Obstructive sleep apnea (adult) (pediatric): Secondary | ICD-10-CM | POA: Diagnosis not present

## 2018-11-22 DIAGNOSIS — Z043 Encounter for examination and observation following other accident: Secondary | ICD-10-CM | POA: Diagnosis not present

## 2018-11-22 DIAGNOSIS — Z79899 Other long term (current) drug therapy: Secondary | ICD-10-CM | POA: Diagnosis not present

## 2018-11-22 DIAGNOSIS — E785 Hyperlipidemia, unspecified: Secondary | ICD-10-CM | POA: Diagnosis not present

## 2018-11-22 DIAGNOSIS — Z7982 Long term (current) use of aspirin: Secondary | ICD-10-CM | POA: Diagnosis not present

## 2018-11-22 DIAGNOSIS — K5730 Diverticulosis of large intestine without perforation or abscess without bleeding: Secondary | ICD-10-CM | POA: Diagnosis not present

## 2018-11-22 DIAGNOSIS — S300XXA Contusion of lower back and pelvis, initial encounter: Secondary | ICD-10-CM | POA: Diagnosis not present

## 2018-11-30 DIAGNOSIS — D751 Secondary polycythemia: Secondary | ICD-10-CM | POA: Diagnosis not present

## 2018-11-30 DIAGNOSIS — Z79899 Other long term (current) drug therapy: Secondary | ICD-10-CM | POA: Diagnosis not present

## 2018-11-30 DIAGNOSIS — D649 Anemia, unspecified: Secondary | ICD-10-CM | POA: Diagnosis not present

## 2018-11-30 DIAGNOSIS — Z720 Tobacco use: Secondary | ICD-10-CM | POA: Diagnosis not present

## 2018-12-07 DIAGNOSIS — J449 Chronic obstructive pulmonary disease, unspecified: Secondary | ICD-10-CM | POA: Diagnosis not present

## 2018-12-15 ENCOUNTER — Ambulatory Visit (INDEPENDENT_AMBULATORY_CARE_PROVIDER_SITE_OTHER): Payer: Medicare Other

## 2018-12-15 ENCOUNTER — Other Ambulatory Visit: Payer: Self-pay

## 2018-12-15 DIAGNOSIS — Z0000 Encounter for general adult medical examination without abnormal findings: Secondary | ICD-10-CM | POA: Diagnosis not present

## 2018-12-15 DIAGNOSIS — Z1231 Encounter for screening mammogram for malignant neoplasm of breast: Secondary | ICD-10-CM | POA: Diagnosis not present

## 2019-01-03 DIAGNOSIS — J9611 Chronic respiratory failure with hypoxia: Secondary | ICD-10-CM | POA: Diagnosis not present

## 2019-01-03 DIAGNOSIS — J449 Chronic obstructive pulmonary disease, unspecified: Secondary | ICD-10-CM | POA: Diagnosis not present

## 2019-01-06 DIAGNOSIS — J449 Chronic obstructive pulmonary disease, unspecified: Secondary | ICD-10-CM | POA: Diagnosis not present

## 2019-02-06 DIAGNOSIS — J449 Chronic obstructive pulmonary disease, unspecified: Secondary | ICD-10-CM | POA: Diagnosis not present

## 2019-03-08 DIAGNOSIS — J449 Chronic obstructive pulmonary disease, unspecified: Secondary | ICD-10-CM | POA: Diagnosis not present

## 2019-03-23 DIAGNOSIS — Z23 Encounter for immunization: Secondary | ICD-10-CM | POA: Diagnosis not present

## 2019-03-23 DIAGNOSIS — J439 Emphysema, unspecified: Secondary | ICD-10-CM | POA: Diagnosis not present

## 2019-03-23 DIAGNOSIS — M810 Age-related osteoporosis without current pathological fracture: Secondary | ICD-10-CM | POA: Diagnosis not present

## 2019-03-23 DIAGNOSIS — E7800 Pure hypercholesterolemia, unspecified: Secondary | ICD-10-CM | POA: Diagnosis not present

## 2019-03-23 DIAGNOSIS — E1169 Type 2 diabetes mellitus with other specified complication: Secondary | ICD-10-CM | POA: Diagnosis not present

## 2019-04-05 DIAGNOSIS — J9611 Chronic respiratory failure with hypoxia: Secondary | ICD-10-CM | POA: Diagnosis not present

## 2019-04-05 DIAGNOSIS — J449 Chronic obstructive pulmonary disease, unspecified: Secondary | ICD-10-CM | POA: Diagnosis not present

## 2019-04-05 DIAGNOSIS — R911 Solitary pulmonary nodule: Secondary | ICD-10-CM | POA: Diagnosis not present

## 2019-04-08 DIAGNOSIS — J449 Chronic obstructive pulmonary disease, unspecified: Secondary | ICD-10-CM | POA: Diagnosis not present

## 2019-05-09 DIAGNOSIS — J449 Chronic obstructive pulmonary disease, unspecified: Secondary | ICD-10-CM | POA: Diagnosis not present

## 2019-06-01 IMAGING — CT CT ANGIO CHEST
2 of 7 series · 18 of 46 positions shown · IV contrast (APPLIED)
Comparison: None.

CLINICAL DATA: Acute respiratory failure. COPD. Swelling and
tenderness of the right leg. Recent 8 hour train ride.

EXAM:
CT ANGIOGRAPHY CHEST WITH CONTRAST
TECHNIQUE: Multidetector CT imaging of the chest was performed using the
standard protocol during bolus administration of intravenous
contrast. Multiplanar CT image reconstructions and MIPs were
obtained to evaluate the vascular anatomy.
CONTRAST:  100mL [id number] IOPAMIDOL ([id number]) INJECTION 76%

[Series 9: thins · axial · 0.70mm/px · z∈[+1007,+1294]mm · 15 of 462 slices shown]
[im 26/462  lung]
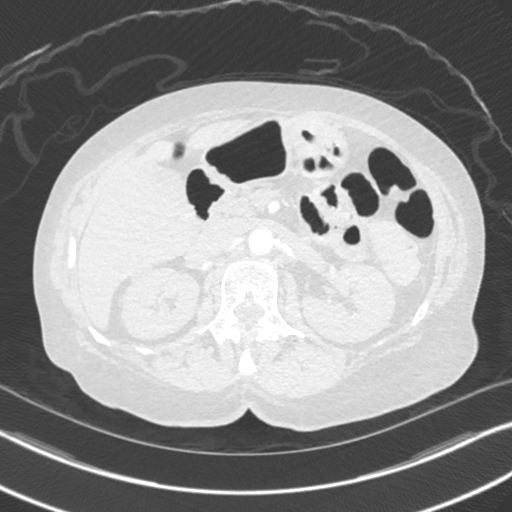
[im 52/462  soft-tissue]
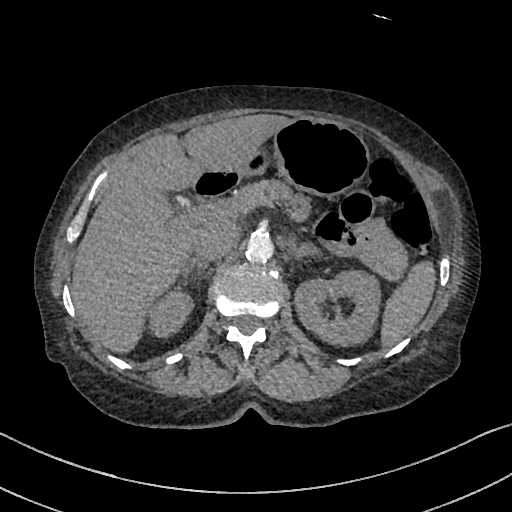
[im 77/462  lung]
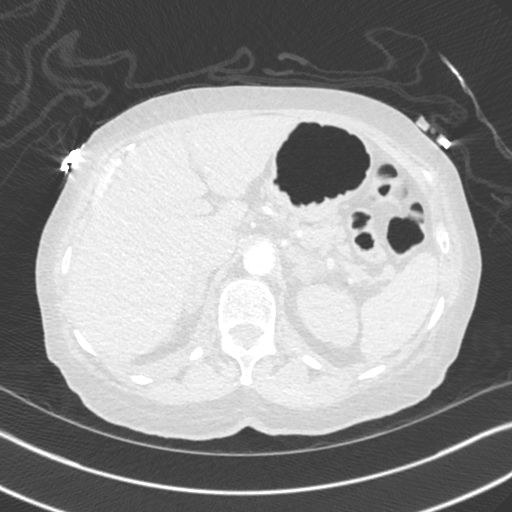
[im 103/462  soft-tissue]
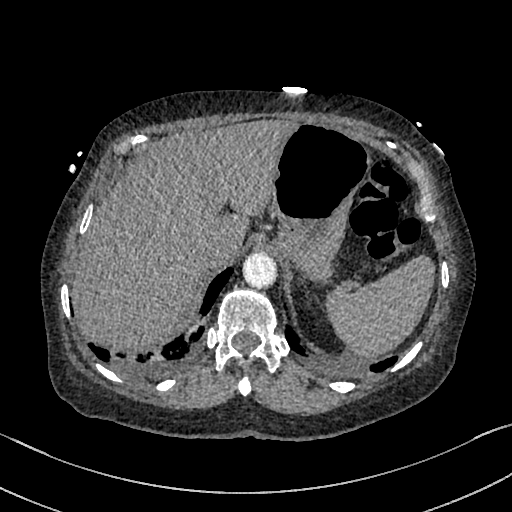
[im 154/462  lung]
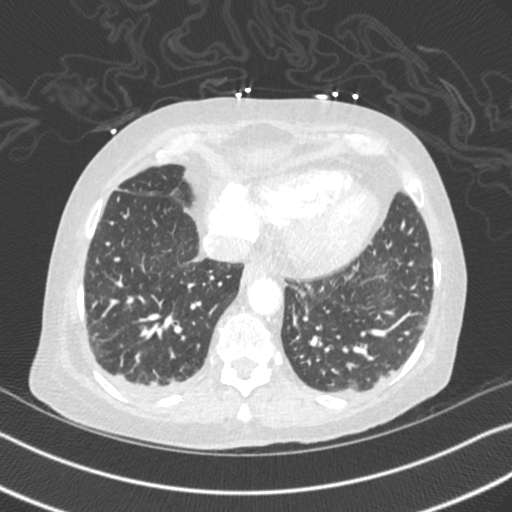
[im 180/462  soft-tissue]
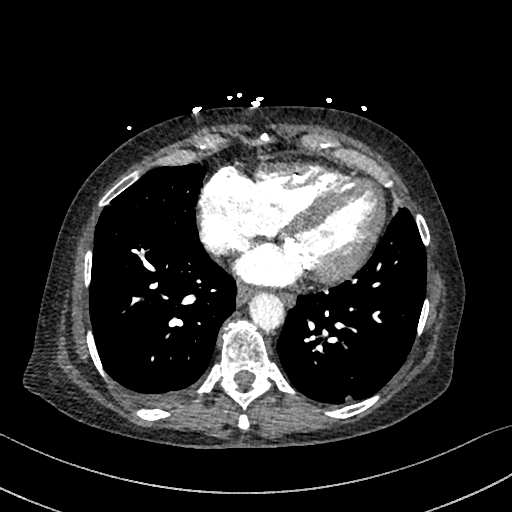
[im 205/462  lung]
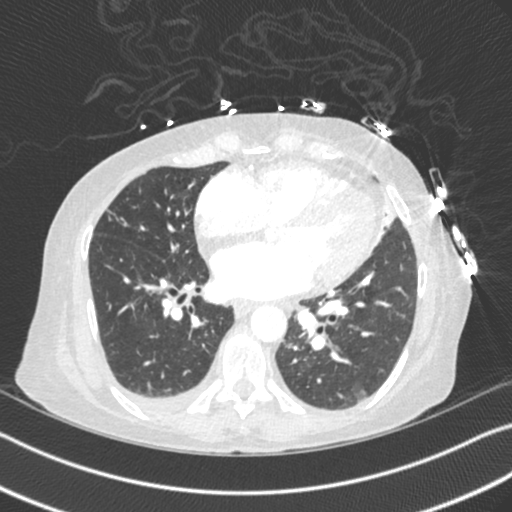
[im 231/462  soft-tissue]
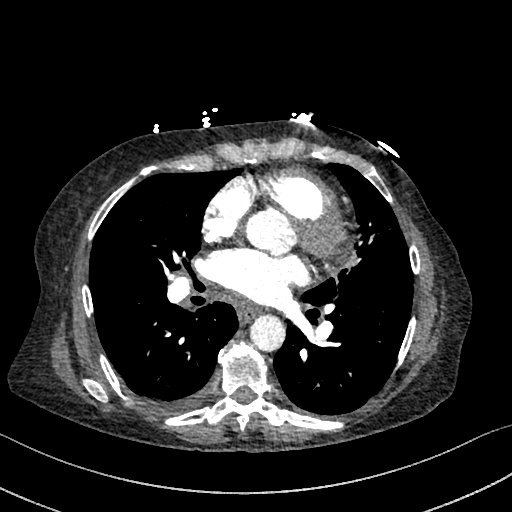
[im 257/462  lung]
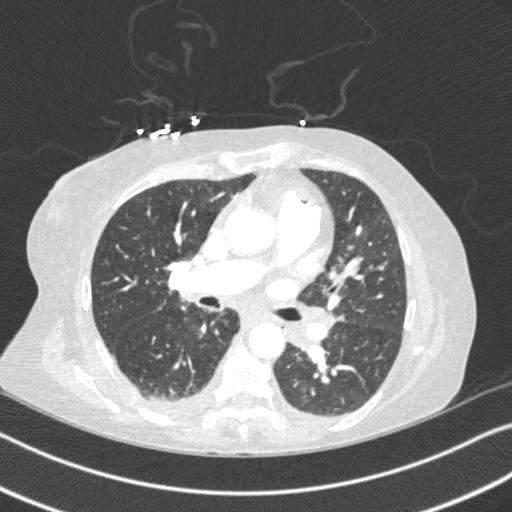
[im 282/462  soft-tissue]
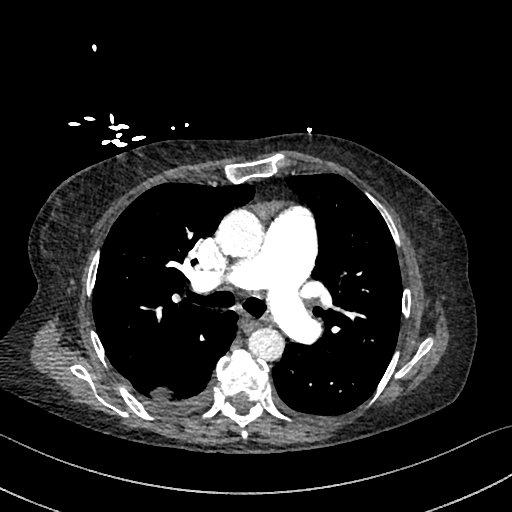
[im 308/462  lung]
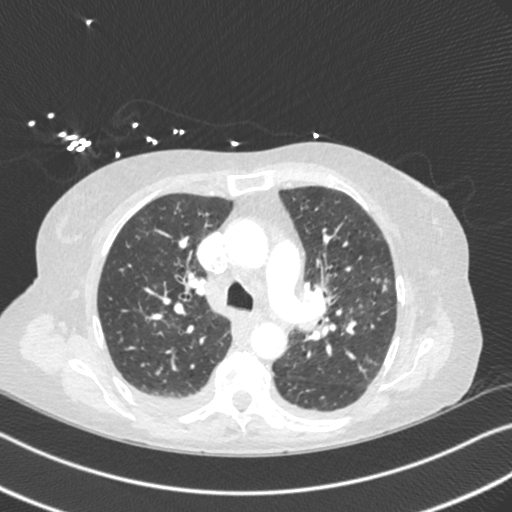
[im 359/462  soft-tissue]
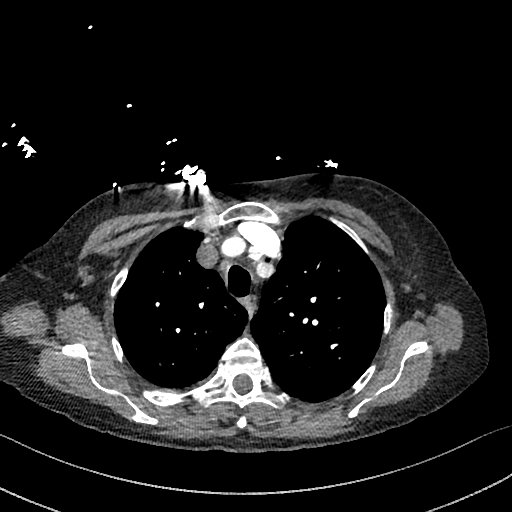
[im 385/462  lung]
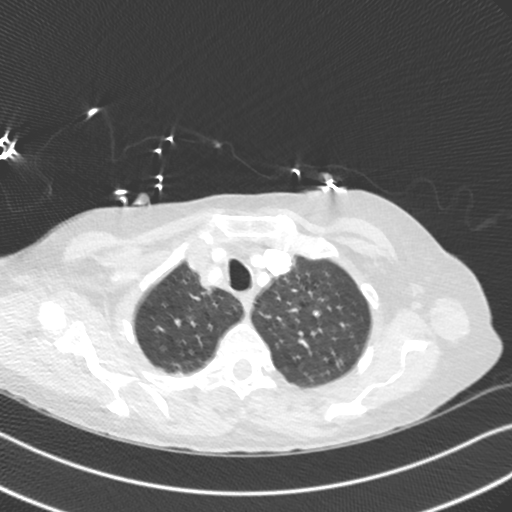
[im 410/462  soft-tissue]
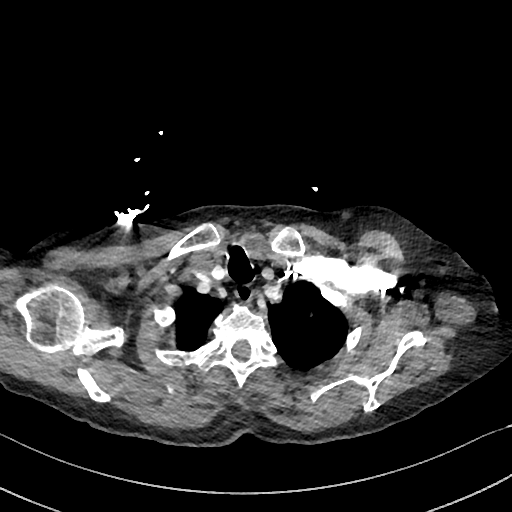
[im 436/462  lung]
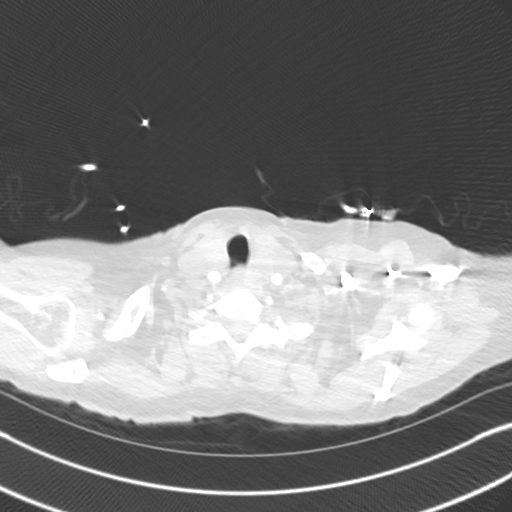

[Series 10: cor · coronal · 0.63mm/px · 3 of 136 slices shown]
[im 34/136  soft-tissue]
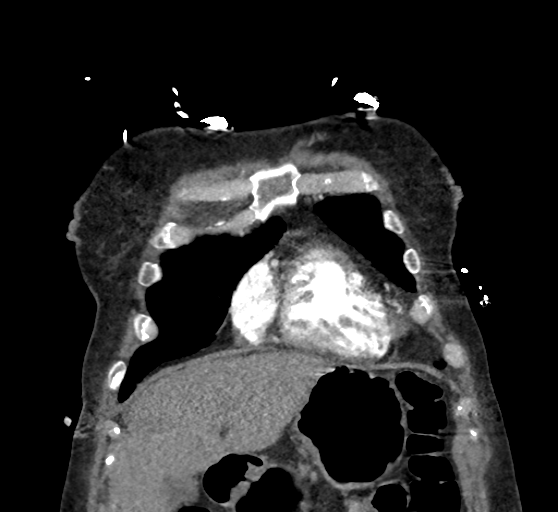
[im 68/136  soft-tissue]
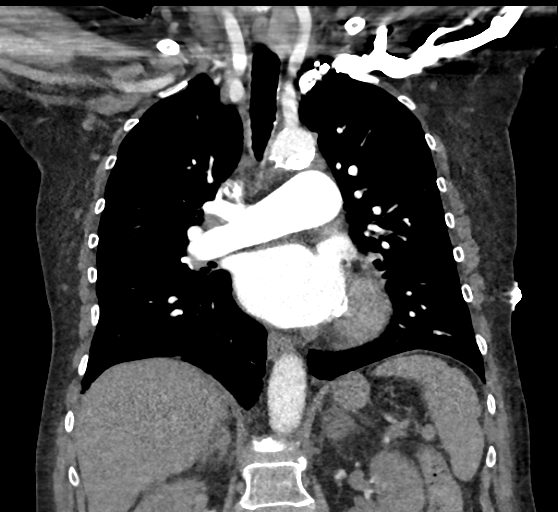
[im 102/136  soft-tissue]
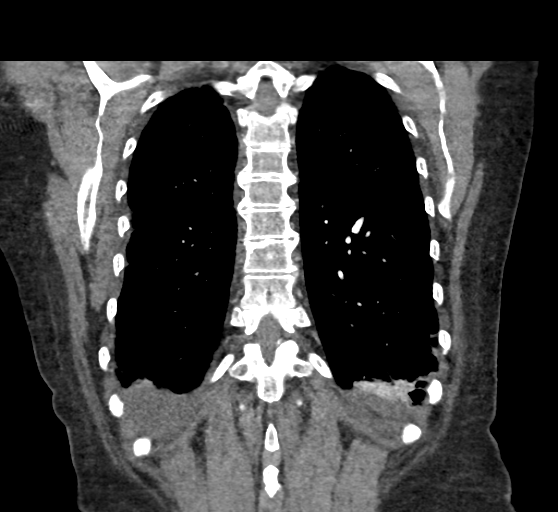

[18 of 46 positions shown; findings below may reference images not displayed]

FINDINGS: Cardiovascular: Good opacification of the central and segmental
pulmonary arteries. No focal filling defects. No evidence of
significant pulmonary embolus. Mild enlargement of the main
pulmonary artery may reflect pulmonary arterial hypertension in the
appropriate clinical setting. Normal caliber thoracic aorta with
scattered calcification. No aortic dissection. Great vessel origins
are patent. Normal heart size. No pericardial effusions.
Calcification in the mitral valve annulus and coronary arteries.

Mediastinum/Nodes: Esophagus is decompressed. Mild prominence of
mediastinal lymph nodes diffusely without pathologic enlargement,
likely reactive.

Lungs/Pleura: Motion artifact limits examination. Minimal bilateral
pleural effusions with basilar atelectasis. Suggestion of patchy
tree-in-bud infiltrates in the upper lungs bilaterally suggesting
bronchopneumonia or airways disease. Airways are patent without
evidence of mucous plugging. No pneumothorax.

Upper Abdomen: Bilateral adrenal gland nodules with low-attenuation
change suggesting benign adenomas.

Musculoskeletal: Degenerative changes in the spine. Mild superior
endplate depression at a midthoracic vertebra, likely chronic.

Review of the MIP images confirms the above findings.
IMPRESSION: 1. No evidence of significant pulmonary embolus.
2. Minimal bilateral pleural effusions with basilar atelectasis.
3. Patchy tree-in-bud infiltrates in the upper lungs bilaterally
suggesting bronchopneumonia or airways disease.
4. Bilateral adrenal gland nodules with low-attenuation change
suggesting benign adenomas.

Aortic Atherosclerosis ([id number]).

## 2019-06-06 DIAGNOSIS — J449 Chronic obstructive pulmonary disease, unspecified: Secondary | ICD-10-CM | POA: Diagnosis not present

## 2019-07-04 DIAGNOSIS — J449 Chronic obstructive pulmonary disease, unspecified: Secondary | ICD-10-CM | POA: Diagnosis not present

## 2019-07-04 DIAGNOSIS — J9611 Chronic respiratory failure with hypoxia: Secondary | ICD-10-CM | POA: Diagnosis not present

## 2019-07-07 DIAGNOSIS — J449 Chronic obstructive pulmonary disease, unspecified: Secondary | ICD-10-CM | POA: Diagnosis not present

## 2019-07-12 DIAGNOSIS — E1169 Type 2 diabetes mellitus with other specified complication: Secondary | ICD-10-CM | POA: Diagnosis not present

## 2019-07-12 DIAGNOSIS — J309 Allergic rhinitis, unspecified: Secondary | ICD-10-CM | POA: Diagnosis not present

## 2019-07-12 DIAGNOSIS — E7800 Pure hypercholesterolemia, unspecified: Secondary | ICD-10-CM | POA: Diagnosis not present

## 2019-07-12 DIAGNOSIS — M810 Age-related osteoporosis without current pathological fracture: Secondary | ICD-10-CM | POA: Diagnosis not present

## 2019-08-06 DIAGNOSIS — J449 Chronic obstructive pulmonary disease, unspecified: Secondary | ICD-10-CM | POA: Diagnosis not present

## 2019-09-06 DIAGNOSIS — J449 Chronic obstructive pulmonary disease, unspecified: Secondary | ICD-10-CM | POA: Diagnosis not present

## 2019-09-27 DIAGNOSIS — Z87891 Personal history of nicotine dependence: Secondary | ICD-10-CM | POA: Diagnosis not present

## 2019-09-27 DIAGNOSIS — F17210 Nicotine dependence, cigarettes, uncomplicated: Secondary | ICD-10-CM | POA: Diagnosis not present

## 2019-10-03 DIAGNOSIS — J449 Chronic obstructive pulmonary disease, unspecified: Secondary | ICD-10-CM | POA: Diagnosis not present

## 2019-10-03 DIAGNOSIS — J9611 Chronic respiratory failure with hypoxia: Secondary | ICD-10-CM | POA: Diagnosis not present

## 2019-10-03 DIAGNOSIS — R911 Solitary pulmonary nodule: Secondary | ICD-10-CM | POA: Diagnosis not present

## 2019-10-06 DIAGNOSIS — J449 Chronic obstructive pulmonary disease, unspecified: Secondary | ICD-10-CM | POA: Diagnosis not present

## 2019-10-27 DIAGNOSIS — M899 Disorder of bone, unspecified: Secondary | ICD-10-CM | POA: Diagnosis not present

## 2019-10-27 DIAGNOSIS — E7800 Pure hypercholesterolemia, unspecified: Secondary | ICD-10-CM | POA: Diagnosis not present

## 2019-10-27 DIAGNOSIS — E1169 Type 2 diabetes mellitus with other specified complication: Secondary | ICD-10-CM | POA: Diagnosis not present

## 2019-10-27 DIAGNOSIS — J439 Emphysema, unspecified: Secondary | ICD-10-CM | POA: Diagnosis not present

## 2019-10-27 DIAGNOSIS — M810 Age-related osteoporosis without current pathological fracture: Secondary | ICD-10-CM | POA: Diagnosis not present

## 2019-10-27 DIAGNOSIS — Z0000 Encounter for general adult medical examination without abnormal findings: Secondary | ICD-10-CM | POA: Diagnosis not present

## 2019-10-27 DIAGNOSIS — E559 Vitamin D deficiency, unspecified: Secondary | ICD-10-CM | POA: Diagnosis not present

## 2019-10-27 DIAGNOSIS — R5383 Other fatigue: Secondary | ICD-10-CM | POA: Diagnosis not present

## 2019-11-06 DIAGNOSIS — J449 Chronic obstructive pulmonary disease, unspecified: Secondary | ICD-10-CM | POA: Diagnosis not present

## 2019-11-07 ENCOUNTER — Other Ambulatory Visit: Payer: Self-pay | Admitting: Unknown Physician Specialty

## 2019-11-07 DIAGNOSIS — Z1231 Encounter for screening mammogram for malignant neoplasm of breast: Secondary | ICD-10-CM

## 2019-11-07 DIAGNOSIS — Z780 Asymptomatic menopausal state: Secondary | ICD-10-CM

## 2019-12-07 DIAGNOSIS — J449 Chronic obstructive pulmonary disease, unspecified: Secondary | ICD-10-CM | POA: Diagnosis not present

## 2020-01-04 DIAGNOSIS — F17210 Nicotine dependence, cigarettes, uncomplicated: Secondary | ICD-10-CM | POA: Diagnosis not present

## 2020-01-04 DIAGNOSIS — J449 Chronic obstructive pulmonary disease, unspecified: Secondary | ICD-10-CM | POA: Diagnosis not present

## 2020-01-04 DIAGNOSIS — R911 Solitary pulmonary nodule: Secondary | ICD-10-CM | POA: Diagnosis not present

## 2020-01-04 DIAGNOSIS — J9611 Chronic respiratory failure with hypoxia: Secondary | ICD-10-CM | POA: Diagnosis not present

## 2020-01-06 DIAGNOSIS — J449 Chronic obstructive pulmonary disease, unspecified: Secondary | ICD-10-CM | POA: Diagnosis not present

## 2020-02-06 DIAGNOSIS — J449 Chronic obstructive pulmonary disease, unspecified: Secondary | ICD-10-CM | POA: Diagnosis not present

## 2020-03-07 DIAGNOSIS — J449 Chronic obstructive pulmonary disease, unspecified: Secondary | ICD-10-CM | POA: Diagnosis not present

## 2020-03-27 ENCOUNTER — Other Ambulatory Visit: Payer: Self-pay

## 2020-03-27 ENCOUNTER — Ambulatory Visit (INDEPENDENT_AMBULATORY_CARE_PROVIDER_SITE_OTHER): Payer: Medicare Other

## 2020-03-27 DIAGNOSIS — Z1231 Encounter for screening mammogram for malignant neoplasm of breast: Secondary | ICD-10-CM
